# Patient Record
Sex: Male | Born: 1955 | Race: White | Hispanic: No | Marital: Married | Smoking: Never smoker
Health system: Southern US, Community
[De-identification: ages and names within clinical notes are randomized; demographics above are authoritative.]

## PROBLEM LIST (undated history)

## (undated) DIAGNOSIS — K219 Gastro-esophageal reflux disease without esophagitis: Secondary | ICD-10-CM

## (undated) DIAGNOSIS — I2510 Atherosclerotic heart disease of native coronary artery without angina pectoris: Secondary | ICD-10-CM

## (undated) DIAGNOSIS — Z8679 Personal history of other diseases of the circulatory system: Secondary | ICD-10-CM

## (undated) DIAGNOSIS — I10 Essential (primary) hypertension: Secondary | ICD-10-CM

## (undated) DIAGNOSIS — Z955 Presence of coronary angioplasty implant and graft: Secondary | ICD-10-CM

## (undated) DIAGNOSIS — Z8701 Personal history of pneumonia (recurrent): Secondary | ICD-10-CM

## (undated) HISTORY — DX: Personal history of pneumonia (recurrent): Z87.01

## (undated) HISTORY — PX: TONSILLECTOMY: SUR1361

## (undated) HISTORY — DX: Personal history of other diseases of the circulatory system: Z86.79

---

## 2006-05-30 ENCOUNTER — Ambulatory Visit (HOSPITAL_BASED_OUTPATIENT_CLINIC_OR_DEPARTMENT_OTHER): Admission: RE | Admit: 2006-05-30 | Discharge: 2006-05-30 | Payer: Self-pay | Admitting: Orthopedic Surgery

## 2012-02-03 DIAGNOSIS — Z8679 Personal history of other diseases of the circulatory system: Secondary | ICD-10-CM

## 2012-02-03 HISTORY — DX: Personal history of other diseases of the circulatory system: Z86.79

## 2012-02-06 ENCOUNTER — Other Ambulatory Visit: Payer: Self-pay | Admitting: Cardiology

## 2012-02-06 DIAGNOSIS — I10 Essential (primary) hypertension: Secondary | ICD-10-CM

## 2012-02-06 DIAGNOSIS — I200 Unstable angina: Secondary | ICD-10-CM | POA: Insufficient documentation

## 2012-02-06 DIAGNOSIS — E1169 Type 2 diabetes mellitus with other specified complication: Secondary | ICD-10-CM | POA: Insufficient documentation

## 2012-02-06 DIAGNOSIS — E8881 Metabolic syndrome: Secondary | ICD-10-CM

## 2012-02-06 DIAGNOSIS — E669 Obesity, unspecified: Secondary | ICD-10-CM

## 2012-02-06 DIAGNOSIS — R030 Elevated blood-pressure reading, without diagnosis of hypertension: Secondary | ICD-10-CM

## 2012-02-06 HISTORY — DX: Essential (primary) hypertension: I10

## 2012-02-06 HISTORY — DX: Unstable angina: I20.0

## 2012-02-07 ENCOUNTER — Encounter (HOSPITAL_COMMUNITY): Payer: Self-pay | Admitting: Pharmacy Technician

## 2012-02-07 HISTORY — PX: DOPPLER ECHOCARDIOGRAPHY: SHX263

## 2012-02-08 ENCOUNTER — Encounter (HOSPITAL_COMMUNITY): Payer: Self-pay | Admitting: Cardiology

## 2012-02-09 ENCOUNTER — Encounter (HOSPITAL_COMMUNITY)
Admission: RE | Disposition: A | Discharge status: Home / Self Care | Payer: Self-pay | Source: Ambulatory Visit | Attending: Cardiology

## 2012-02-09 ENCOUNTER — Ambulatory Visit (HOSPITAL_COMMUNITY)
Admission: RE | Admit: 2012-02-09 | Discharge: 2012-02-10 | Disposition: A | Discharge status: Home / Self Care | Source: Ambulatory Visit | Attending: Cardiology | Admitting: Cardiology

## 2012-02-09 ENCOUNTER — Encounter (HOSPITAL_COMMUNITY): Payer: Self-pay | Admitting: General Practice

## 2012-02-09 ENCOUNTER — Other Ambulatory Visit: Payer: Self-pay

## 2012-02-09 DIAGNOSIS — I25119 Atherosclerotic heart disease of native coronary artery with unspecified angina pectoris: Secondary | ICD-10-CM | POA: Diagnosis present

## 2012-02-09 DIAGNOSIS — E8881 Metabolic syndrome: Secondary | ICD-10-CM | POA: Diagnosis present

## 2012-02-09 DIAGNOSIS — I200 Unstable angina: Secondary | ICD-10-CM | POA: Diagnosis present

## 2012-02-09 DIAGNOSIS — I2510 Atherosclerotic heart disease of native coronary artery without angina pectoris: Secondary | ICD-10-CM

## 2012-02-09 DIAGNOSIS — E1169 Type 2 diabetes mellitus with other specified complication: Secondary | ICD-10-CM | POA: Diagnosis present

## 2012-02-09 DIAGNOSIS — E669 Obesity, unspecified: Secondary | ICD-10-CM | POA: Diagnosis present

## 2012-02-09 DIAGNOSIS — E119 Type 2 diabetes mellitus without complications: Secondary | ICD-10-CM | POA: Insufficient documentation

## 2012-02-09 DIAGNOSIS — I10 Essential (primary) hypertension: Secondary | ICD-10-CM | POA: Diagnosis present

## 2012-02-09 DIAGNOSIS — E66811 Obesity, class 1: Secondary | ICD-10-CM | POA: Diagnosis present

## 2012-02-09 DIAGNOSIS — Z955 Presence of coronary angioplasty implant and graft: Secondary | ICD-10-CM

## 2012-02-09 HISTORY — DX: Essential (primary) hypertension: I10

## 2012-02-09 HISTORY — DX: Presence of coronary angioplasty implant and graft: Z95.5

## 2012-02-09 HISTORY — PX: LEFT HEART CATHETERIZATION WITH CORONARY ANGIOGRAM: SHX5451

## 2012-02-09 HISTORY — DX: Atherosclerotic heart disease of native coronary artery without angina pectoris: I25.10

## 2012-02-09 HISTORY — DX: Gastro-esophageal reflux disease without esophagitis: K21.9

## 2012-02-09 HISTORY — PX: CORONARY ANGIOPLASTY WITH STENT PLACEMENT: SHX49

## 2012-02-09 LAB — GLUCOSE, CAPILLARY
Glucose-Capillary: 117 mg/dL — ABNORMAL HIGH (ref 70–99)
Glucose-Capillary: 115 mg/dL — ABNORMAL HIGH (ref 70–99)
Glucose-Capillary: 155 mg/dL — ABNORMAL HIGH (ref 70–99)

## 2012-02-09 SURGERY — LEFT HEART CATHETERIZATION WITH CORONARY ANGIOGRAM
Anesthesia: LOCAL

## 2012-02-09 MED ORDER — HEPARIN (PORCINE) IN NACL 2-0.9 UNIT/ML-% IJ SOLN
INTRAMUSCULAR | Status: AC
  Filled 2012-02-09: qty 2000

## 2012-02-09 MED ORDER — VERAPAMIL HCL 2.5 MG/ML IV SOLN
INTRAVENOUS | Status: AC
  Filled 2012-02-09: qty 2

## 2012-02-09 MED ORDER — SODIUM CHLORIDE 0.9 % IV SOLN
1.0000 mL/kg/h | INTRAVENOUS | Status: AC
  Administered 2012-02-09: 1 mL/kg/h via INTRAVENOUS

## 2012-02-09 MED ORDER — FENTANYL CITRATE 0.05 MG/ML IJ SOLN
INTRAMUSCULAR | Status: AC
  Filled 2012-02-09: qty 2

## 2012-02-09 MED ORDER — PRASUGREL HCL 10 MG PO TABS
10.0000 mg | ORAL_TABLET | Freq: Every day | ORAL | Status: DC
  Administered 2012-02-10: 10 mg via ORAL
  Filled 2012-02-09: qty 1

## 2012-02-09 MED ORDER — SODIUM CHLORIDE 0.9 % IV SOLN
INTRAVENOUS | Status: DC
  Administered 2012-02-09: 08:00:00 via INTRAVENOUS

## 2012-02-09 MED ORDER — HEPARIN SODIUM (PORCINE) 1000 UNIT/ML IJ SOLN
INTRAMUSCULAR | Status: AC
  Filled 2012-02-09: qty 1

## 2012-02-09 MED ORDER — ASPIRIN 81 MG PO CHEW
CHEWABLE_TABLET | ORAL | Status: AC
  Administered 2012-02-09: 324 mg via ORAL
  Filled 2012-02-09: qty 4

## 2012-02-09 MED ORDER — PRASUGREL HCL 10 MG PO TABS
ORAL_TABLET | ORAL | Status: AC
  Filled 2012-02-09: qty 6

## 2012-02-09 MED ORDER — NITROGLYCERIN 0.2 MG/ML ON CALL CATH LAB
INTRAVENOUS | Status: AC
  Filled 2012-02-09: qty 1

## 2012-02-09 MED ORDER — ACETAMINOPHEN 325 MG PO TABS: 650.0000 mg | ORAL_TABLET | ORAL | Status: DC | PRN

## 2012-02-09 MED ORDER — SODIUM CHLORIDE 0.9 % IJ SOLN: 3.0000 mL | Freq: Two times a day (BID) | INTRAMUSCULAR | Status: DC

## 2012-02-09 MED ORDER — MIDAZOLAM HCL 2 MG/2ML IJ SOLN
INTRAMUSCULAR | Status: AC
  Filled 2012-02-09: qty 2

## 2012-02-09 MED ORDER — ASPIRIN EC 81 MG PO TBEC
81.0000 mg | DELAYED_RELEASE_TABLET | Freq: Every day | ORAL | Status: DC
  Administered 2012-02-10: 81 mg via ORAL
  Filled 2012-02-09 (×2): qty 1

## 2012-02-09 MED ORDER — METOPROLOL TARTRATE 12.5 MG HALF TABLET
12.5000 mg | ORAL_TABLET | Freq: Two times a day (BID) | ORAL | Status: DC
  Administered 2012-02-09 – 2012-02-10 (×2): 12.5 mg via ORAL
  Filled 2012-02-09 (×3): qty 1

## 2012-02-09 MED ORDER — ONDANSETRON HCL 4 MG/2ML IJ SOLN: 4.0000 mg | Freq: Four times a day (QID) | INTRAMUSCULAR | Status: DC | PRN

## 2012-02-09 MED ORDER — INSULIN ASPART 100 UNIT/ML ~~LOC~~ SOLN
0.0000 [IU] | Freq: Three times a day (TID) | SUBCUTANEOUS | Status: DC
Start: 2012-02-09 — End: 2012-02-10
  Filled 2012-02-09: qty 3

## 2012-02-09 MED ORDER — ATORVASTATIN CALCIUM 20 MG PO TABS
20.0000 mg | ORAL_TABLET | Freq: Every day | ORAL | Status: DC
  Administered 2012-02-09: 20 mg via ORAL
  Filled 2012-02-09 (×3): qty 1

## 2012-02-09 MED ORDER — DIAZEPAM 5 MG PO TABS
ORAL_TABLET | ORAL | Status: AC
  Administered 2012-02-09: 5 mg via ORAL
  Filled 2012-02-09: qty 1

## 2012-02-09 MED ORDER — DIAZEPAM 5 MG PO TABS
5.0000 mg | ORAL_TABLET | ORAL | Status: AC
  Administered 2012-02-09: 5 mg via ORAL

## 2012-02-09 MED ORDER — LIDOCAINE HCL (PF) 1 % IJ SOLN
INTRAMUSCULAR | Status: AC
  Filled 2012-02-09: qty 30

## 2012-02-09 MED ORDER — BIVALIRUDIN 250 MG IV SOLR
INTRAVENOUS | Status: AC
  Filled 2012-02-09: qty 250

## 2012-02-09 MED ORDER — SODIUM CHLORIDE 0.9 % IV SOLN: 250.0000 mL | INTRAVENOUS | Status: DC

## 2012-02-09 MED ORDER — SODIUM CHLORIDE 0.9 % IJ SOLN: 3.0000 mL | INTRAMUSCULAR | Status: DC | PRN

## 2012-02-09 MED ORDER — ASPIRIN 81 MG PO CHEW
324.0000 mg | CHEWABLE_TABLET | ORAL | Status: AC
  Administered 2012-02-09: 324 mg via ORAL

## 2012-02-09 NOTE — H&P (Signed)
History and Physical Interval Note:  NAME:  Kristopher Jackson   MRN: [id number] DOB:  Nov 22, 1956   ADMIT DATE: 02/09/2012   02/09/2012 9:13 AM  Kristopher Jackson is a 56 y.o. male with DM-2, likely HTN & obesity who was seen by me @ [facility] on 02/06/12 for ~4 months of progressively worsening shortness of breath on exertion that is now accompanied by left sided chest pressure (over the past ~2 months).  Sx ocuring with less exertion & more frequency --> concerning for an unstable angina process of crescendo angina.  Please see my dictated note in the shadow chart for full details.  He has had an echocardiogram on 02/07/12 that was essentially normal with normal EF >55% and no WMA or valvular lesions.  Based upon his pre-test probability of CAD, he is referred for invasive cardiac evaluation.  The patients' history has been reviewed, patient examined, no change in status from most recent note, stable for surgery. I have reviewed the patients' chart and labs. Questions were answered to the patient's satisfaction.   Past Medical History  Diagnosis Date  . Hypertension   . Diabetes mellitus   . Angina    Medications Prior to Admission  Medication Dose Route Frequency Provider Last Rate Last Dose  . 0.9 %  sodium chloride infusion   Intravenous Continuous Marykay Lex, MD 75 mL/hr at 02/09/12 [id number]    . aspirin chewable tablet 324 mg  324 mg Oral Pre-Cath Marykay Lex, MD   324 mg at 02/09/12 0816  . diazepam (VALIUM) tablet 5 mg  5 mg Oral On Call Marykay Lex, MD   5 mg at 02/09/12 0819  . fentaNYL (SUBLIMAZE) 0.05 MG/ML injection           . heparin 2-0.9 UNIT/ML-% infusion           . lidocaine (XYLOCAINE) 1 % injection           . midazolam (VERSED) 2 MG/2ML injection           . nitroGLYCERIN (NTG ON-CALL) 0.2 mg/mL injection           . sodium chloride 0.9 % injection 3 mL  3 mL Intravenous Q12H Marykay Lex, MD      . verapamil (ISOPTIN) 2.5 MG/ML injection            Medications Prior to  Admission  Medication Sig Dispense Refill  . aspirin EC 81 MG tablet Take 81 mg by mouth daily.      . metFORMIN (GLUCOPHAGE) 500 MG tablet Take 500 mg by mouth every morning.      . metoprolol tartrate (LOPRESSOR) 25 MG tablet Take 25 mg by mouth 2 (two) times daily.      . Multiple Vitamin (MULITIVITAMIN WITH MINERALS) TABS Take 1 tablet by mouth daily.         Kristopher Jackson has presented today for surgery, with the diagnosis of chest pain The various methods of treatment have been discussed with the patient and family. After consideration of risks, benefits and other options for treatment, the patient has consented to Procedure(s):  LEFT HEART CATHETERIZATION AND CORONARY ANGIOGRAPHY +/- AD HOC PERCUTANEOUS CORONARY INTERVENTION  as a surgical intervention.   PLAN:  LHC / Angiography +/- PCI via R Radial approach   Kristopher Jackson W [facility] [address] [place], [place]  [address]  [phone]  02/09/2012 9:13 AM

## 2012-02-09 NOTE — Progress Notes (Signed)
TR BAND REMOVAL  LOCATION:  right radial  DEFLATED PER PROTOCOL:  yes  TIME BAND OFF / DRESSING APPLIED:   1630   SITE UPON ARRIVAL:   Level 0  SITE AFTER BAND REMOVAL:  Level 0  REVERSE ALLEN'S TEST:    positive  CIRCULATION SENSATION AND MOVEMENT:  Within Normal Limits  yes  COMMENTS:    

## 2012-02-09 NOTE — CV Procedure (Signed)
[facility]     CARDIAC CATHETERIZATION REPORT  Kristopher Jackson MRN: [id number] DOB: [birth year]  ADMIT DATE: 02/09/2012  Performing Cardiologist: Marykay Lex  Primary Physician: Brent Bulla, MD  Primary Cardiologist:  Marykay Lex, MD, MS  Procedures Performed:  Left Heart Catheterization via 5 Fr Right Radial Artery access  Aortic Root Angiogram (LAO) 20 ml/sec for 20 ml total contrast  Native Coronary Angiography  IC NTG Injection 200 mcg x 2  Percutaneous Coronary Artery Intervention on mid LAD 80% lesions (At branch-point with Major Diagonal (D2) and septal perforator (SP2) with a Integrity Resolute DES 3.0 mm x 26 mm; final diameter 3.3 mm proximal & mid; 3.1 mm distal.  Indication(s): Worsening shortness of breath and chest pain with exertion  Crescendo / unstable Angina  Diabetes Type 2  Obesity  History: Kristopher Jackson is a 56 y.o. male with a history of diabetes obesity and borderline hypertension who was referred to me at [facility] for evaluation of progressively worsening shortness of breath on exertion now complicated by chest pain with exertion. He states he gets chest tightness and short of breath which is walking to the elevator. Based on his risk factors of diabetes obesity and portal hypertension, I felt the most appropriate to evaluate his symptoms was to proceed to diagnostic cardiac catheterization with possible percutaneous intervention as his symptoms are quite classic for crescendo angina/unstable angina.  Consent: During his clinic visit, the procedure with Risks/Benefits/Alternatives and Indications was reviewed with the patient (and family).  All questions were answered.    Risks / Complications include, but not limited to: Death, MI, CVA/TIA, VF/VT (with defibrillation), Bradycardia (need for temporary pacer placement), contrast induced nephropathy, bleeding / bruising / hematoma /  pseudoaneurysm, vascular or coronary injury (with possible emergent CT or Vascular Surgery), adverse medication reactions, infection.    The patient voicedunderstanding and agree to proceed.   Consent for signed by MD and patient with RN witness -- placed on chart.  Procedure: The patient was brought to the 2nd Floor [facility] in the fasting state and prepped and draped in the usual sterile fashion for (Right groin or radial) access.  A modified Allen's test with plethysmography was performed on the right wrist demonstrating adequate Ulnar Artery collateral flow.   Sterile technique was used including antiseptics, cap, gloves, gown, hand hygiene, mask and sheet. Skin prep: Chlorhexidine;  Time Out: Verified patient identification, verified procedure, site/side was marked, verified correct patient position, special equipment/implants available, medications/allergies/relevent history reviewed, required imaging and test results available.  Performed  The right wrist was anesthetized with 1% subcutaneous Lidocaine.  The right radial artery was accessed using the Seldinger Technique with placement of a 5 Fr Glide Sheath. The sheath was aspirated and flushed.  Then a total of 10 ml of standard Radial Artery Cocktail (see medications) was infused.  Radial Cocktail: 5 mg Verapamil, 400 mcg NTG, 2 ml 2% Lidocaine  A 5 Fr TIG 4.0 Catheter was advanced of over a Long Exchange Safety J wire into the ascending Aorta.  This catheter was was not successful used to engage either the right or left coronary artery therefore it was exchanged for a 5 [place] JL4 catheter followed by a XB 35 guide catheter for engaging the left coronary artery. Multiple cineangiographic views of both the Left coronary artery system(s) were performed. With this indicated were unable to actually visualize the circumflex coronary artery. The catheter was exchanged  for a 5 [place] JR 4 catheter was used to engage the  right coronary artery multiple cineangiographic views of the right courses were obtained. This also did not localize the circumflex. A 5 French angle pigtail catheter was advanced into the descending aorta pullback across the aortic valve and was used to perform a root aortic angiogram in the LAO projection which did demonstrate the circumflex coming off from a separate ostium in the LAD. Multiple additional catheters including an XB 4.0, a JL 3.5 followed by an AL-1 guiding catheterTuhi attached to allow for wire support. With this apparatus were able to successfully performed nonselective angiography of the circumflex artery which had a separate ostium superior and posterior to the LAD left main.  The TIG 4.0 catheter upon initial placement, was advanced across the Aortic Valve over a wire.  LV hemodynamics were measured (and) Left Ventriculography was not performed.   Hemodynamics:  Central Aortic Pressure: 86/61 mmHg; mean 73 mmHg  LV Pressure / LV End diastolic Pressure: 86/10 mmHg; 15 mmHg Ventriculography was not performed. His echocardiogram demonstrated EF of 55% no wall motion abnormalities on 02/07/2012. Aortic root angiography revealed normal caliber aortic root with no aortic insufficiency.  Coronary Angiographic Data:   Dominance: Right  Left Main:  Essentially is the proximal portion of the LAD. It is long and there is a proximal small diagonal branch was delineates the transition point in the LAD.  Left Anterior Descending (LAD):  Large caliber vessel after the first small septal perforator and diagonal branch which has a ostial lesion in it there is luminal irregularities was a slight pre-stenotic dilatation followed by 50% tubular lesion just prior to a branch point for a large septal perforator (SP2) and major diagonal branch (D2).  Just after this branch point there is a tapering tubular 80% lesion with the vessel becomes normal at the next small septal perforator.    There is  also a distal LAD 50-60% lesion just prior to the apex; the LAD itself right reaches around the apex  2nd diagonal (D2):  This is a major vessel large in caliber that has minimal luminal irregularities and covers a large distribution.     Circumflex (LCx):  This vessel is a nondominant vessel it has a separate ostium from the left main/LAD. There is a proximal 30% lesion, the vessel courses the intraventricular groove giving rise to a large OM branch and then the AV groove portion branches again in 2 posterior lateral branches.  1st obtuse marginal:   Moderate caliber vessel, branches with no significant lesions.  Right Coronary Artery: Very large caliber dominant vessel gives rise to an RV marginal branch as well as a 2 atrial branches. Just at the distal 30-40% lesion prior to bifurcation into the posterior atrioventricular groove branch and PDA.  Right Posterior Descending Artery (RPDA):  Moderate caliber vessel and multiple septal perforators and no significant lesions. Minimal luminal irregularities  Right Atrio-ventricular Groove Branch (RPAVB):  Moderate caliber vessel branches of the tube and PLBs and the AV nodal artery.  PERCUTANEOUS CORONARY INTERVENTION PROCEDURE  After reviewing the initial cineangiography images, the culprit lesion(s) was identified, and the decision was made to proceed with percutaneous coronary intervention.  The 5Fr Sheath was exchanged over a wire for a 6Fr sheath.   A weight based bolus of IV Angiomax was administered and the drip was continued until completion of the procedure.    Oral Prasugrel 60 mg was administered.  The Guide catheter(s) were advanced over  a J-wire and used to engage the Left Main/LAD Coronary Artery. An ACT of > 200 Sec was confirmed prior to advancing the Guidewire. A BMW wire was used to cross into the major diagonal branch and a Prowater wire was used for the LAD.  At the end of the wire was removed prior to deflating the  stent. Intravenous push and injections were administered prior to each dose balloon or stent angiography.  Lesion #1:  Mid LAD; 20 mm lesion (most significant portion is 80% preceded by a tubular 50%)  Pre-PCI Stenosis: 80 % Post-PCI Stenosis: 0 %     TIMI 3 flow       TIMI 3 flow  Guide Catheter: 6 French XB 3.5   Guidewire: Prowater  Pre-Dilitation Balloon: Emerge Monorail 2.5 mm x 12 mm   1st Inflation:  6 Atm for 30 Sec  Scout angiography did not reveal evidence of dissection or perforation.  There was no evidence of plaque shift into the diagonal branch or septal branch. STENT: Integrity Resolute Drug-Eluting Stent 3.0 mm x 26 mm (to cover the proximal 50% lesion and a tapering portion of distal lesion )   1st Inflation: 10 Atm for  33 Sec; final distal diameter 3.1 mm    Post-Dilitation Balloon:  [place] Sprinter Rx 3.25 mm x 15 mm   1st Inflation:  14 Atm for  45 Sec;   2nd Inflation:  14 Atm for  45 Sec   Final diameter proximally 3.3 mm   Post-deployment cineangiography with and without guidewire in place was performed in multiple orthogonal views demonstrating  excellent stent deployment and apposition. The views  did not reveal evidence of dissection or perforation  The sheath was removed in the Cath Lab with a TR band placed at  12 ml Air at  1114 (time).  Reverse Allen's test with plethysmography revealed non-occlusive hemostasis.  The patient was transported to the PACU in  hemodynamically stable, chest pain-freecondition.   The patient  was stable before, during and following the procedure.   Patient did tolerate procedure well. There were not complications.  EBL: <10 mL.  Medications:  Premedication:  5mg   Valium  Sedation:   3 mg IV Versed,  75 IV mcg Fentanyl  Contrast:   225 Omnipaque  Radial Cocktail: 5 mg Verapamil, 400 mcg NTG, 2 ml 2% Lidocaine  IV Heparin: 5000 units    Angiomax bolus and drip (weight-based))  [place] 60 mg by mouth  Impression:    Severe  single-vessel disease in the mid LAD involving the major diagonal and septal perforator branch point. The lesion spares the diagonal and septal perforators.  Status post successful PTCA-stenting of his mid LAD lesion with an Integrity Resolute DES -- 3.0 mm x 26 mm Postell at approximately 23.3 mm and distally to 3.1 mm.  Residual distal LAD 56 the lesion and moderate 30-40% lesion in the proximal Circumflex and distal dominant RCA.   Separate ostium for the nondominant Circumflex from the left cusp appears to be superior and posterior to the LAD.   Due to separate ostium will would not recommend further catheterization via the radial approach, as his innominate artery tortuosity lead to difficulty with catheter manipulation.  Plan:  Monitor overnight post-cath to care.  Dual antiplatelet therapy with aspirin and Prasugrel for one year minimum  Due to bradycardia Wants Her to Decrease His Beta Blocker Dose 12.5 Twice a Day. We'll Add a Statin for Risk Factor Modification.  Restart metformin 48  hours post-catheterization.  Anticipate discharge in morning if stable he'll followup with me in one to 2 weeks the [facility].  ThThease and results was discussed with the patient (and family). The case and results  will be discussed with the patient's PCP. The case and results was discussed with the patient's Cardiologist.  Time Spend Directly with Patient:   2 hours  Socrates Cahoon W, M.D., M.S. [facility] [address] [place], [place]  [address]  [phone]  02/09/2012 11:48 AM

## 2012-02-09 NOTE — Progress Notes (Signed)
   CARE MANAGEMENT NOTE 02/09/2012  Patient:  Kristopher Jackson, Kristopher Jackson   Account Number:  [id number]  Date Initiated:  02/09/2012  Documentation initiated by:  GRAVES-BIGELOW,Presleigh Feldstein  Subjective/Objective Assessment:   Pt in for LHC. Plan for home on effient. Pt uses [facility]. CM did call and they have effient available. Benefits check was done and co pay cost will be 17.00.     Action/Plan:   MD please write rx for 30 day free effient no refills and then the original with refills. Thanks   Anticipated DC Date:  02/10/2012   Anticipated DC Plan:  HOME/SELF CARE      DC Planning Services  CM consult      Choice offered to / List presented to:             Status of service:  Completed, signed off Medicare Important Message given?   (If response is "NO", the following Medicare IM given date fields will be blank) Date Medicare IM given:   Date Additional Medicare IM given:    Discharge Disposition:  HOME/SELF CARE  Per UR Regulation:    Comments:  02-09-12 [address] Tomi Bamberger, RN,BSN [phone] No other needs assessed by CM.

## 2012-02-10 ENCOUNTER — Encounter (HOSPITAL_COMMUNITY): Payer: Self-pay | Admitting: Cardiology

## 2012-02-10 ENCOUNTER — Other Ambulatory Visit: Payer: Self-pay

## 2012-02-10 LAB — CBC
Hemoglobin: 13.7 g/dL (ref 13.0–17.0)
MCHC: 34.5 g/dL (ref 30.0–36.0)
RBC: 4.61 MIL/uL (ref 4.22–5.81)
WBC: 11.5 10*3/uL — ABNORMAL HIGH (ref 4.0–10.5)

## 2012-02-10 LAB — BASIC METABOLIC PANEL
Chloride: 102 mEq/L (ref 96–112)
GFR calc non Af Amer: 67 mL/min — ABNORMAL LOW (ref >90)
Glucose, Bld: 116 mg/dL — ABNORMAL HIGH (ref 70–99)
Potassium: 4.8 mEq/L (ref 3.5–5.1)
Sodium: 137 mEq/L (ref 135–145)

## 2012-02-10 MED ORDER — NITROGLYCERIN 0.4 MG SL SUBL: 0.4000 mg | SUBLINGUAL_TABLET | SUBLINGUAL | Status: DC | PRN

## 2012-02-10 MED ORDER — PRASUGREL HCL 10 MG PO TABS: 10.0000 mg | ORAL_TABLET | Freq: Every day | ORAL | Status: DC

## 2012-02-10 MED ORDER — ROSUVASTATIN CALCIUM 5 MG PO TABS: 5.0000 mg | ORAL_TABLET | Freq: Every day | ORAL | Status: AC

## 2012-02-10 MED ORDER — METOPROLOL TARTRATE 25 MG PO TABS: ORAL_TABLET | ORAL | Status: DC

## 2012-02-10 MED ORDER — METOPROLOL TARTRATE 12.5 MG HALF TABLET: 12.5000 mg | ORAL_TABLET | Freq: Two times a day (BID) | ORAL | Status: DC

## 2012-02-10 MED FILL — Dextrose Inj 5%: INTRAVENOUS | Qty: 50 | Status: AC

## 2012-02-10 NOTE — Progress Notes (Signed)
Kristopher Jackson is a 56 y.o. male with DM-2, likely HTN & obesity who was seen by me @ [facility] on 02/06/12 for ~4 months of progressively worsening shortness of breath on exertion that is now accompanied by left sided chest pressure (over the past ~2 months). Sx ocuring with less exertion & more frequency --> concerning for an unstable angina process of crescendo angina. Please see my dictated note in the shadow chart for full details.  He has had an echocardiogram on 02/07/12 that was essentially normal with normal EF >55% and no WMA or valvular lesions. Based upon his pre-test probability of CAD, he is referred for invasive cardiac evaluation.    Subjective: No pain with brisk walking, no SOB either  Objective: Vital signs in last 24 hours: Temp:  [97.5 F (36.4 C)-98 F (36.7 C)] 97.7 F (36.5 C) (03/08 0900) Pulse Rate:  [48-71] 71  (03/08 0900) Resp:  [15-25] 17  (03/08 0900) BP: (97-142)/(56-81) 140/81 mmHg (03/08 0900) SpO2:  [97 %-100 %] 98 % (03/08 0900) Weight:  [98.1 kg (216 lb 4.3 oz)] 98.1 kg (216 lb 4.3 oz) (03/08 0352) Weight change:  Last BM Date: 02/09/12 Intake/Output from previous day: +480 03/07 0701 - 03/08 0700 In: 2180.8 [P.O.:1320; I.V.:860.8] Out: 1700 [Urine:1700] Intake/Output this shift:    PE: General appearance: alert, cooperative, no distress and pleasant mood & affect Neck: no adenopathy, no carotid bruit, no JVD, supple, symmetrical, trachea midline and thyroid not enlarged, symmetric, no tenderness/mass/nodules Lungs: clear to auscultation bilaterally, normal percussion bilaterally and non-labored Heart: regular rate and rhythm, S1, S2 normal, no murmur, click, rub or gallop Abdomen: soft, non-tender; bowel sounds normal; no masses,  no organomegaly Extremities: extremities normal, atraumatic, no cyanosis or edema Pulses: 2+ and symmetric Neurologic: Grossly normal R Radial access site c/d/i; normalreverse Allen's   Lab Results:  Basename 02/10/12 0346    WBC 11.5*  HGB 13.7  HCT 39.7  PLT 172   BMET  Basename 02/10/12 0346  NA 137  K 4.8  CL 102  CO2 28  GLUCOSE 116*  BUN 15  CREATININE 1.19  CALCIUM 8.8     Studies/Results:  CARDIAC CATHETERIZATION / PCI Mid LAD; 20 mm lesion (most significant portion is 80% preceded by a tubular 50%)  Pre-PCI Stenosis: 80 % Post-PCI Stenosis: 0 %  TIMI 3 flow TIMI 3 flow  Guide Catheter: 6 French XB 3.5 Guidewire: Prowater  Pre-Dilitation Balloon: Emerge Monorail 2.5 mm x 12 mm  1st Inflation: 6 Atm for 30 Sec  Scout angiography did not reveal evidence of dissection or perforation. There was no evidence of plaque shift into the diagonal branch or septal branch.  STENT: Integrity Resolute Drug-Eluting Stent 3.0 mm x 26 mm (to cover the proximal 50% lesion and a tapering portion of distal lesion )  1st Inflation: 10 Atm for 33 Sec; final distal diameter 3.1 mm  Post-Dilitation Balloon: [place] Sprinter Rx 3.25 mm x 15 mm  1st Inflation: 14 Atm for 45 Sec;  2nd Inflation: 14 Atm for 45 Sec ; Final diameter proximally 3.3 mm  Medications: I have reviewed the patient's current medications.    Marland Kitchen aspirin EC  81 mg Oral Daily  . atorvastatin  20 mg Oral q1800  . bivalirudin      . heparin      . heparin      . insulin aspart  0-9 Units Subcutaneous TID WC  . lidocaine      . metoprolol tartrate  12.5 mg Oral BID  . midazolam      . nitroGLYCERIN      . prasugrel      . prasugrel  10 mg Oral Daily  . verapamil      . DISCONTD: sodium chloride  3 mL Intravenous Q12H  . DISCONTD: sodium chloride  3 mL Intravenous Q12H   Assessment/Plan: Patient Active Problem List  Diagnoses  . Unstable angina pectoris  . Diabetes mellitus type 2 in obese  . Obesity (BMI 30.0-34.9)  . Metabolic syndrome  . Borderline hypertension  . Presence of stent in LAD coronary artery - Integrity Resolute Drug Eluting Stent, mid LAD  . CAD (coronary artery disease), native coronary artery - 80% mid LAD - s/p PCI  with DES with distal ~50%, 30-40% RCA & LCx (anomalous LCx takeoff)   PLAN:  Ambulates without complications.  Plan to discharge home. Glucose stable, no metformin X 48 hours.  LOS: 1 day   INGOLD,LAURA R 02/10/2012, 9:09 AM  I have seen and examined (above exam performed by me) the patient along with Nada Boozer, NP.  I have reviewed the chart, notes and new data.  I agree with Laura's note.  Brief Description: 56 y/o man with DM & obesity referred for LHC/PCI for SSx of cresecnedo / unstable angina.   Doing well s/p PCI of Mid LAD yesterday.  Key new complaints: none - feels good Key examination changes: normal exam as noted above, wrist looks great Key new findings / data: labs reviewed.    PLAN: Stable for discharge today -- ASA, Prasugrel, 1/2 dose BB & statin F/u with me in 1-2 weeks.  Marykay Lex, M.D., M.S. [facility] [address] [place], [place]  [address]  [phone]  02/10/2012 10:39 AM

## 2012-02-10 NOTE — Progress Notes (Signed)
CARDIAC REHAB PHASE I   PRE:  Rate/Rhythm: 64SR  BP:  Supine:   Sitting: 122/84  Standing:    SaO2: 99%RA  MODE:  Ambulation: 650 ft   POST:  Rate/Rhythem: 94SR  BP:  Supine:   Sitting: 140/81  Standing:    SaO2: 100%RA 0730-0820 Pt walked 650 ft on RA with steady gait. Denied chest pain. Stated not SOB. He has a fast pace when walking. Education completed. 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 Gave heart healthy and diabetic diets. Declined  Phase 2.  Duanne Limerick

## 2012-02-10 NOTE — Discharge Instructions (Signed)
Call [facility] if any bleeding, swelling or drainage at cath site.  May shower, no tub baths for 48 hours for groin sticks.    Take 1 NTG, under your tongue, while sitting.  If no relief of pain may repeat NTG, one tab every 5 minutes up to 3 tablets total over 15 minutes.  If no relief CALL 911.  If you have dizziness/lightheadness  while taking NTG, stop taking and call 911.       Heart Healthy Diabetic Diet  No lifting for 5 days, no driving for 3 days  DO NOT TAKE METFORMIN UNTIL 02/12/12 it may interact with cath dye.  We have added Crestor for your cholesterol

## 2012-02-12 NOTE — Discharge Summary (Signed)
Physician Discharge Summary  Patient ID: Riaz Onorato MRN: [id number] DOB/AGE: 1956-05-18 56 y.o.  Admit date: 02/09/2012 Discharge date: 02/10/2012  Discharge Diagnoses:  Principal Problem:  *Unstable angina pectoris Active Problems:  Presence of stent in LAD coronary artery - Integrity Resolute Drug Eluting Stent, mid LAD, 02/09/2012  CAD (coronary artery disease), native coronary artery - 80% mid LAD - s/p PCI with DES with distal ~50%, 30-40% RCA & LCx (anomalous LCx takeoff)  Diabetes mellitus type 2 in obese  Obesity (BMI 30.0-34.9)  Metabolic syndrome  Borderline hypertension   Discharged Condition: good  Procedures: 02/09/2012 combined left heart cath by Dr. Bryan Lemma.  02/09/2012: PTCA and Resolute drug eluting stent placed to the mid LAD.Marland Kitchen  Hospital Course: Fotios Amos is a 56 y.o. male with DM-2, likely HTN & obesity who was seen by Dr. Herbie Baltimore @ [facility] on 02/06/12 for ~4 months of progressively worsening shortness of breath on exertion that is now accompanied by left sided chest pressure (over the past ~2 months). Sx ocuring with less exertion & more frequency --> concerning for an unstable angina process of crescendo angina. Please see dictated note in the shadow chart for full details.   He has had an echocardiogram on 02/07/12 that was essentially normal with normal EF >55% and no WMA or valvular lesions. Based upon his pre-test probability of CAD, he is referred for invasive cardiac evaluation.  Cardiac cath revealed 80% mid LAD stenosis patient underwent PTCA and stent deployment with a resolute drug-eluting stent without complications.  Patient was admitted overnight for monitoring purposes he was stable the next morning he ambulated without complications glucose was stable and metformin will be held for 48 hours.  Discharged with aspirin,decreased his beta blocker secondary to bradycardia and added statin as well as Effient  follow up with Dr. Herbie Baltimore.     Consults:  None  Significant Diagnostic Studies:  Labs at discharge sodium 137 potassium 4.8 chloride 102 CO2 28 BUN 15 creatinine 1.19 calcium 8.8 glucose 116  Hemoglobin 13.7 hematocrit 39.7 WBC 11.5 platelets 172 EKG is sinus rhythm without acute changes.  Procedures Performed:  Left Heart Catheterization via 5 Fr Right Radial Artery access  Aortic Root Angiogram (LAO) 20 ml/sec for 20 ml total contrast  Native Coronary Angiography  IC NTG Injection 200 mcg x 2  Percutaneous Coronary Artery Intervention on mid LAD 80% lesions (At branch-point with Major Diagonal (D2) and septal perforator (SP2) with a Integrity Resolute DES 3.0 mm x 26 mm; final diameter 3.3 mm proximal & mid; 3.1 mm distal.   Discharge Exam: Blood pressure 140/81, pulse 71, temperature 97.7 F (36.5 C), temperature source Oral, resp. rate 17, height 5\' 8"  (1.727 m), weight 98.1 kg (216 lb 4.3 oz), SpO2 98.00%.   General appearance: alert, cooperative, no distress and pleasant mood & affect  Neck: no adenopathy, no carotid bruit, no JVD, supple, symmetrical, trachea midline and thyroid not enlarged, symmetric, no tenderness/mass/nodules  Lungs: clear to auscultation bilaterally, normal percussion bilaterally and non-labored  Heart: regular rate and rhythm, S1, S2 normal, no murmur, click, rub or gallop  Abdomen: soft, non-tender; bowel sounds normal; no masses, no organomegaly  Extremities: extremities normal, atraumatic, no cyanosis or edema  Pulses: 2+ and symmetric  Neurologic: Grossly normal  R Radial access site c/d/i; normalreverse Allen's    Disposition: 01-Home or Self Care   Medication List  As of 02/12/2012  2:45 PM   TAKE these medications  aspirin EC 81 MG tablet   Take 81 mg by mouth daily.      metFORMIN 500 MG tablet   Commonly known as: GLUCOPHAGE   Take 500 mg by mouth every morning.      metoprolol tartrate 25 MG tablet   Commonly known as: LOPRESSOR   Take 12.5 mg  (half of 25 mg tablet)  twice a day.      mulitivitamin with minerals Tabs   Take 1 tablet by mouth daily.      nitroGLYCERIN 0.4 MG SL tablet   Commonly known as: NITROSTAT   Place 1 tablet (0.4 mg total) under the tongue every 5 (five) minutes as needed for chest pain.      prasugrel 10 MG Tabs   Commonly known as: EFFIENT   Take 1 tablet (10 mg total) by mouth daily.      rosuvastatin 5 MG tablet   Commonly known as: CRESTOR   Take 1 tablet (5 mg total) by mouth daily.           Follow-up Information    Follow up with Marykay Lex, MD on 02/24/2012. (You will follow up with Dr. Elissa Hefty PA  Carmelina Paddock  at 2:10)    Contact information:   [facility] [address], [address] [place] [address] [phone]        Discharge instructions: Call [facility] if any bleeding, swelling or drainage at cath site.  May shower, no tub baths for 48 hours for groin sticks.    Take 1 NTG, under your tongue, while sitting.  If no relief of pain may repeat NTG, one tab every 5 minutes up to 3 tablets total over 15 minutes.  If no relief CALL 911.  If you have dizziness/lightheadness  while taking NTG, stop taking and call 911.       Heart Healthy Diabetic Diet  No lifting for 5 days, no driving for 3 days  DO NOT TAKE METFORMIN UNTIL 02/12/12 it may interact with cath dye.  We have added Crestor for your cholesterol    Signed: INGOLD,LAURA R 02/12/2012, 2:45 PM   I saw Mr. Overby on the day of discharge. He was doing great well post radial artery access cardiac catheterization with PCI to the LAD. He was walking around the hallways doing well not having any chest discomfort or shortness of breath. He is definitely stable for discharge. I'll see him back in clinic in followup. I have added statin for endothelial stabilization.  He'll be on aspirin and Effient.  Marykay Lex, M.D., M.S. [facility] [address] [place], [place]  [address]  [phone]  02/12/2012 3:12 PM

## 2013-06-14 ENCOUNTER — Telehealth: Payer: Self-pay | Admitting: Cardiology

## 2013-06-14 NOTE — Telephone Encounter (Signed)
Med list compared to paper chart and updated.    Returned call.  Left message that message received and sent to MD.  Also to call back Monday before 4pm.  Message forwarded to Dr. Herbie Baltimore.  Paper chart# [id number] on cart.

## 2013-06-14 NOTE — Telephone Encounter (Signed)
Pt would like for someone to call back about his medication. He is supposed to have some dental work done and he needs to find out if it is ok.

## 2013-06-16 NOTE — Telephone Encounter (Signed)
He had a PCI in March 2013, so he should be fine stopping his Plavix or Effient for dental work -- just restart the next day.  Marykay Lex, MD

## 2013-06-17 NOTE — Telephone Encounter (Signed)
Shouldn't need to stop ASA.  Can stop Effient x 5-7 days pre-procedure.  Marykay Lex, MD

## 2013-06-17 NOTE — Telephone Encounter (Signed)
How long can pt stop ASA and Effient before dental work?

## 2013-06-17 NOTE — Telephone Encounter (Signed)
Letter drafted.  Call to pt and left message to call back today before 4pm.  (2nd message)  Letter mailed to pt.

## 2013-07-03 ENCOUNTER — Other Ambulatory Visit: Payer: Self-pay | Admitting: *Deleted

## 2013-07-03 ENCOUNTER — Other Ambulatory Visit: Payer: Self-pay | Admitting: Cardiology

## 2013-07-03 MED ORDER — ATORVASTATIN CALCIUM 20 MG PO TABS: 20.0000 mg | ORAL_TABLET | Freq: Every day | ORAL | Status: DC

## 2013-07-03 NOTE — Telephone Encounter (Signed)
Rx was printed for [facility] to sign and will be faxed once signature is received.

## 2013-10-17 ENCOUNTER — Other Ambulatory Visit: Payer: Self-pay | Admitting: Cardiology

## 2013-10-18 ENCOUNTER — Other Ambulatory Visit (HOSPITAL_COMMUNITY): Payer: Self-pay | Admitting: Cardiology

## 2013-10-18 ENCOUNTER — Other Ambulatory Visit: Payer: Self-pay

## 2013-10-18 ENCOUNTER — Other Ambulatory Visit: Payer: Self-pay | Admitting: Cardiology

## 2013-10-18 MED ORDER — NITROGLYCERIN 0.4 MG SL SUBL: 0.4000 mg | SUBLINGUAL_TABLET | SUBLINGUAL | Status: DC | PRN

## 2013-10-18 MED ORDER — LISINOPRIL 5 MG PO TABS: ORAL_TABLET | ORAL | Status: DC

## 2013-10-18 NOTE — Telephone Encounter (Signed)
Rx was sent to pharmacy electronically. 

## 2013-12-24 ENCOUNTER — Other Ambulatory Visit: Payer: Self-pay | Admitting: Cardiology

## 2013-12-27 ENCOUNTER — Other Ambulatory Visit: Payer: Self-pay | Admitting: *Deleted

## 2013-12-27 MED ORDER — PRASUGREL HCL 10 MG PO TABS: 10.0000 mg | ORAL_TABLET | Freq: Every day | ORAL | Status: DC

## 2014-02-10 ENCOUNTER — Telehealth: Payer: Self-pay | Admitting: *Deleted

## 2014-02-10 DIAGNOSIS — E782 Mixed hyperlipidemia: Secondary | ICD-10-CM

## 2014-02-10 DIAGNOSIS — Z79899 Other long term (current) drug therapy: Secondary | ICD-10-CM

## 2014-02-10 NOTE — Telephone Encounter (Signed)
Paper chart requested.

## 2014-02-10 NOTE — Telephone Encounter (Signed)
Please call the pt back  °

## 2014-02-10 NOTE — Telephone Encounter (Signed)
Pt has an appointment with Dr. Herbie BaltimoreHarding on 3/26 @9 :30 and he needs to have blood work done prior and wants to know if we can mail him a lab slip.  DH

## 2014-02-10 NOTE — Telephone Encounter (Signed)
Returned call and pt verified x 2.  Pt informed message received.  Asked if labs done w/ PCP and denied.  Advised to have Hgb A1C ordered by PCP if needed and that other labs will be ordered for Dr. Herbie BaltimoreHarding.  Pt advised to have drawn at least 24 hrs before appt so that results can be reviewed at appt.  Pt verbalized understanding and agreed w/ plan.  Lab(s) ordered and Lab slip mailed.

## 2014-02-10 NOTE — Telephone Encounter (Signed)
Returned call and pt verified x 2 w/ pt's wife.  Stated she was returning a call.  Didn't know pt called.  Will call pt and have him call back.  Asked if she would inform pt to have RN paged to speak w/ him since he is at work.  Agreed.  Per Jasmine DecemberSharon, RN, need to know if pt had labs drawn w/ PCP so no duplication.

## 2014-02-10 NOTE — Telephone Encounter (Signed)
Returning your call. °

## 2014-02-10 NOTE — Telephone Encounter (Signed)
Returned call.  Left message to call back before 4pm.  Need to know if pt had labs at PCP's office.

## 2014-02-13 ENCOUNTER — Telehealth: Payer: Self-pay | Admitting: Cardiology

## 2014-02-13 NOTE — Telephone Encounter (Signed)
Patient is there at the office , but they need a lab order ..Kristopher Jackson

## 2014-02-13 NOTE — Telephone Encounter (Signed)
Returned call to General MillsMelissa.  Stated pt has already left the office.  Stated they received the order from pt's PCP to draw an A1C, but pt wasn't sure what else was needed.  Stated he said he was told to go there to get A1C and then get his cholesterol checked w/ our office.  RN informed Efraim KaufmannMelissa that pt was instructed to get an ORDER for Hgb A1C if PCP needed it w/ annual labs and that other lab order would be mailed to him, which was done on Monday.  Melissa verbalized understanding and stated only A1C was drawn b/c they didn't know what else was needed.  RN informed her that pt should have received lab order by now or will be receiving any day.  He will have to have those drawn before appt.  Call to pt and verified.  Pt informed of call from lab.  Stated he went to get the order, but they drew the A1C while he was there.  Stated he wanted to have the orders for the labs from our office so he could have it all done at one time.  Pt denied receiving lab orders mailed.  Pt will find out if they will draw the labs once he receives the orders and have faxed to our office so [facility].

## 2014-02-24 ENCOUNTER — Encounter: Payer: Self-pay | Admitting: *Deleted

## 2014-02-24 LAB — COMPREHENSIVE METABOLIC PANEL
ALT: 24 U/L (ref 0–53)
AST: 20 U/L (ref 0–37)
Albumin: 4.3 g/dL (ref 3.5–5.2)
Alkaline Phosphatase: 108 U/L (ref 39–117)
BILIRUBIN TOTAL: 0.4 mg/dL (ref 0.2–1.2)
BUN: 17 mg/dL (ref 6–23)
CO2: 26 mEq/L (ref 19–32)
Calcium: 9 mg/dL (ref 8.4–10.5)
Chloride: 106 mEq/L (ref 96–112)
Creat: 1.08 mg/dL (ref 0.50–1.35)
Glucose, Bld: 156 mg/dL — ABNORMAL HIGH (ref 70–99)
Potassium: 4.6 mEq/L (ref 3.5–5.3)
SODIUM: 141 meq/L (ref 135–145)
Total Protein: 5.8 g/dL — ABNORMAL LOW (ref 6.0–8.3)

## 2014-02-24 LAB — LIPID PANEL
Cholesterol: 106 mg/dL (ref 0–200)
HDL: 37 mg/dL — ABNORMAL LOW (ref >39)
LDL Cholesterol: 60 mg/dL (ref 0–99)
Total CHOL/HDL Ratio: 2.9 Ratio
Triglycerides: 44 mg/dL (ref <150)
VLDL: 9 mg/dL (ref 0–40)

## 2014-02-25 ENCOUNTER — Encounter: Payer: Self-pay | Admitting: *Deleted

## 2014-02-25 NOTE — Telephone Encounter (Signed)
Quick Note:  Chemistries with the exception of glucose looked pretty good. Protein is just a little below which would suggest mild protein malnutrition. HDL is a bit below normal, but the other labs look great. HDL is probably low because total cholesterol is low.  Continue current therapy.  Marykay LexHARDING,Ashlye Oviedo W, MD  ______

## 2014-02-27 ENCOUNTER — Encounter: Payer: Self-pay | Admitting: Cardiology

## 2014-02-27 ENCOUNTER — Ambulatory Visit (INDEPENDENT_AMBULATORY_CARE_PROVIDER_SITE_OTHER): Admitting: Cardiology

## 2014-02-27 VITALS — BP 138/70 | HR 61 | Ht 68.0 in | Wt 220.2 lb

## 2014-02-27 DIAGNOSIS — Z79899 Other long term (current) drug therapy: Secondary | ICD-10-CM

## 2014-02-27 DIAGNOSIS — E8881 Metabolic syndrome: Secondary | ICD-10-CM

## 2014-02-27 DIAGNOSIS — E785 Hyperlipidemia, unspecified: Secondary | ICD-10-CM

## 2014-02-27 DIAGNOSIS — R030 Elevated blood-pressure reading, without diagnosis of hypertension: Secondary | ICD-10-CM

## 2014-02-27 DIAGNOSIS — I200 Unstable angina: Secondary | ICD-10-CM

## 2014-02-27 DIAGNOSIS — I2510 Atherosclerotic heart disease of native coronary artery without angina pectoris: Secondary | ICD-10-CM

## 2014-02-27 DIAGNOSIS — Z9861 Coronary angioplasty status: Secondary | ICD-10-CM

## 2014-02-27 DIAGNOSIS — Z955 Presence of coronary angioplasty implant and graft: Secondary | ICD-10-CM

## 2014-02-27 DIAGNOSIS — E669 Obesity, unspecified: Secondary | ICD-10-CM

## 2014-02-27 MED ORDER — PRASUGREL HCL 10 MG PO TABS: 10.0000 mg | ORAL_TABLET | Freq: Every day | ORAL | Status: DC

## 2014-02-27 MED ORDER — NITROGLYCERIN 0.4 MG SL SUBL: 0.4000 mg | SUBLINGUAL_TABLET | SUBLINGUAL | Status: DC | PRN

## 2014-02-27 MED ORDER — CLOPIDOGREL BISULFATE 75 MG PO TABS: 75.0000 mg | ORAL_TABLET | Freq: Every day | ORAL | Status: DC

## 2014-02-27 NOTE — Patient Instructions (Signed)
Stop effient  and start CLOPIDOGREL 75 MG ONCE A DAY IN THREE WEEKS HAVE LABS P2Y212  PLEASE GO TO [facility]   Your physician wants you to follow-up in 12 MONTHS DR HARDING. You will receive a reminder letter in the mail two months in advance. If you don't receive a letter, please call our office to schedule the follow-up appointment.

## 2014-03-01 ENCOUNTER — Encounter: Payer: Self-pay | Admitting: Cardiology

## 2014-03-01 DIAGNOSIS — E1169 Type 2 diabetes mellitus with other specified complication: Secondary | ICD-10-CM

## 2014-03-01 DIAGNOSIS — E785 Hyperlipidemia, unspecified: Secondary | ICD-10-CM | POA: Insufficient documentation

## 2014-03-01 HISTORY — DX: Hyperlipidemia, unspecified: E11.69

## 2014-03-01 NOTE — Assessment & Plan Note (Signed)
Date his last visit we increased his Lipitor to 20 mg.  His most recent set of labs are pretty much at goal. His next a followup LAD should be a NMR panel to get a closer assessment of the actual lipid control.

## 2014-03-01 NOTE — Assessment & Plan Note (Addendum)
Thankfully, he has had no recurrent anginal symptoms since his PCI.  He continues to be very active with no symptoms. He is on an ACE inhibitor along with his statin and dual therapy. He is not on a beta blocker because of resting bradycardia.

## 2014-03-01 NOTE — Assessment & Plan Note (Signed)
He is now close to 2 years out from his PCI. I think we can try to switch him to Plavix from Effient. I wanted to have this is viable and, we can then have him start on Plavix. After 3 weeks on Plavix, we will check a P2Y12 assay to determine if he is a appropriate Plavix responder. If so we will continue on the Plavix alone without aspirin. Regardless he would be fine stopping these medications temporary for procedures.

## 2014-03-01 NOTE — Assessment & Plan Note (Signed)
With accommodation of hypertension, obesity, diabetes and dyslipidemia, he certainly has a higher risk. He continued to be on metformin along with statin.

## 2014-03-01 NOTE — Progress Notes (Addendum)
PATIENT: Kristopher Jackson MRN: [id number]  DOB: [birth year]   DOV:03/01/2014 PCP: Abigail Miyamoto, MD  Clinic Note: Chief Complaint  Patient presents with  . Annual Exam    no chest pain, no sob , occasional edema --labs 02/24/14     HPI: Kristopher Jackson is a 58 y.o.  male with a PMH below who presents today for annual followup of his CAD. He had significant exertional (class III) angina back in 2012. He was taken for cardiac catheterization which demonstrated a proximal LAD lesion that was treated with a drug-eluting stent. Since then, his denied any recurrence of these symptoms. I last saw him in March of 2014 and he was doing quite well..  Interval History: He presents today again doing very well without any complaints or concerns. 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 He stills says that during the course of the day he will walk somewhere between 10-15 miles. He denies any chest tightness or pressure with rest or exertion. No PND, orthopnea. No rapid or irregular heartbeats. No lightheadedness, dizziness or wooziness, no syncope/near syncope or TIA/amaurosis fugax symptoms. No melena hematochezia or dysuria. No significant bruising. No claudication.  Past Medical History  Diagnosis Date  . Hypertension   . Diabetes mellitus   . H/O class III angina pectoris 02/2012    - Referred for cardiac cath  . Coronary artery disease 02/09/2012    99% early Mid LAD (@Diag ); not on blocker due to bradycardia  . Presence of stent in LAD coronary artery - 02/09/2012     Integrity Resolute Drug Eluting Stent (3.0 mm x 26 mm -> 3.3 mm), mid LAD, 02/09/2012        . GERD (gastroesophageal reflux disease)   . H/O: pneumonia     Prior Cardiac Evaluation and Past Surgical History: Past Surgical History  Procedure Laterality Date  . Tonsillectomy    . Doppler echocardiography   02/07/2012    EF=>55% BODERLINE LVH,NORMAL DIASTOLIC FUNCTION  . Coronary angioplasty with stent placement  02/09/2012     80% LAD, (AT branch-point with major diagonal (D2) AND SEPTAL PERFORATOR (SP2) with a IntegrityResolute DES 3.0 mm x 26 mm; final diameter 3.3 mm proximal  & mid ; 3.1 mm distal     Allergies  Allergen Reactions  . Parafon Forte Dsc [Chlorzoxazone]   . Septra [Bactrim] Other (See Comments)    Found out in [employer]. Rash itch  . Chlorzoxazone Nausea And Vomiting    Current Outpatient Prescriptions  Medication Sig Dispense Refill  . aspirin EC 81 MG tablet Take 81 mg by mouth daily.      Marland Kitchen atorvastatin (LIPITOR) 20 MG tablet TAKE 1 TABLET ONCE DAILY.  30 tablet  8  . b complex vitamins tablet Take 1 tablet by mouth as needed.       Marland Kitchen lisinopril (PRINIVIL,ZESTRIL) 5 MG tablet TAKE 1 TABLET ONCE DAILY.  30 tablet  4  . metFORMIN (GLUCOPHAGE) 500 MG tablet Take 500 mg by mouth every morning.      . Multiple Vitamin (MULITIVITAMIN WITH MINERALS) TABS Take 1 tablet by mouth as needed.       . nitroGLYCERIN (NITROSTAT) 0.4 MG SL tablet Place 1 tablet (0.4 mg total) under the tongue every 5 (five) minutes as needed for chest pain.  25 tablet  6  . prasugrel (EFIENT) 10 MG tablet  Take 1 tablet (10 mg total) by mouth daily.  30 tablet  6  . nitroGLYCERIN (NITROSTAT) 0.4 MG SL tablet Place 1 tablet (0.4 mg total) under the tongue every 5 (five) minutes as needed for chest pain.  25 tablet  4   No current facility-administered medications for this visit.    History   Social History Narrative   Married father of one. Exercises routinely without significant symptoms. 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   He does not smoke.   He drinks up to 3 alcohol drinks a week.   ROS: A comprehensive Review of Systems - Negative except Mild musculoskeletal symptoms with soreness from doing different odd jobs  around the farm  PHYSICAL EXAM BP 138/70  Pulse 61  Ht 5\' 8"  (1.727 m)  Wt 220 lb 3.2 oz (99.882 kg)  BMI 33.49 kg/m2 General appearance: cooperative, appears stated age, no distress, mildly obese and Otherwise healthy-appearing. Normal mood and affect. Neck: no adenopathy, no carotid bruit, no JVD, supple, symmetrical, trachea midline and thyroid not enlarged, symmetric, no tenderness/mass/nodules Lungs: clear to auscultation bilaterally, normal percussion bilaterally and Nonlabored, good air movement Heart: regular rate and rhythm, S1, S2 normal, no murmur, click, rub or gallop and normal apical impulse Abdomen: soft, non-tender; bowel sounds normal; no masses,  no organomegaly and Truncal obesity Extremities: extremities normal, atraumatic, no cyanosis or edema Pulses: 2+ and symmetric Neurologic: Grossly normal   Adult ECG Report  Rate: 61 ;  Rhythm: normal sinus rhythm  QRS Axis: -46 ;  PR Interval: 146 ;  QRS Duration: 94 ; QTc: 408  Voltages: Normal  Conduction Disturbances: left anterior fascicular block  Other Abnormalities: none   Narrative Interpretation: Relatively normal EKG LAFB Recent Labs: Reviewed in Epic.- 02/24/2014  TC 106, TG 44, HDL 37, LDL 60. Chemistries and LFTs normal with the exception of glucose of 156  ASSESSMENT / PLAN: CAD (coronary artery disease), native coronary artery - 80% mid LAD - s/p PCI with DES with distal ~50%, 30-40% RCA & LCx (anomalous LCx takeoff) Thankfully, he has had no recurrent anginal symptoms since his PCI.  He continues to be very active with no symptoms. He is on an ACE inhibitor along with his statin and dual therapy. He is not on a beta blocker because of resting bradycardia.  Unstable angina pectoris - history of No recurrent symptoms post PCI  Presence of stent in LAD coronary artery - Integrity Resolute Drug Eluting Stent, mid LAD, 02/09/2012 He is now close to 2 years out from his PCI. I think we can try to switch him  to Plavix from Effient. I wanted to have this is viable and, we can then have him start on Plavix. After 3 weeks on Plavix, we will check a P2Y12 assay to determine if he is a appropriate Plavix responder. If so we will continue on the Plavix alone without aspirin. Regardless he would be fine stopping these medications temporary for procedures.  Borderline hypertension He continues to have some borderline blood pressures. He is only on low-dose lisinopril. At this time, not having any more changes as he is stable.  Dyslipidemia, goal LDL below 70 Date his last visit we increased his Lipitor to 20 mg.  His most recent set of labs are pretty much at goal. His next a followup LAD should be a NMR panel to get a closer assessment of the actual  lipid control.  Obesity (BMI 30.0-34.9) He continues to have a hard time losing weight and keeping it off. I again talked to him briefly about different dietary modification options. He is reluctant to try to see a nutritionist as the timing. He went to look into the DASH or Mediterranean diet options.  He is also going to pick up his walking routine with an effort to be more brisk with his walking  Metabolic syndrome With accommodation of hypertension, obesity, diabetes and dyslipidemia, he certainly has a higher risk. He continued to be on metformin along with statin.   Orders Placed This Encounter  Procedures  . Platelet inhibition p2y12  . EKG 12-Lead   Meds ordered this encounter  Medications  . DISCONTD: prasugrel (EFFIENT) 10 MG TABS tablet    Sig: Take 1 tablet (10 mg total) by mouth daily.    Dispense:  7 tablet    Refill:  0  . clopidogrel (PLAVIX) 75 MG tablet    Sig: Take 1 tablet (75 mg total) by mouth daily.    Dispense:  30 tablet    Refill:  6  . nitroGLYCERIN (NITROSTAT) 0.4 MG SL tablet    Sig: Place 1 tablet (0.4 mg total) under the tongue every 5 (five) minutes as needed for chest pain.    Dispense:  25 tablet    Refill:  6     Followup: One year  DAVID W. Herbie Baltimore, M.D., M.S. Interventional Cardiology [facility]

## 2014-03-01 NOTE — Assessment & Plan Note (Signed)
He continues to have some borderline blood pressures. He is only on low-dose lisinopril. At this time, not having any more changes as he is stable.

## 2014-03-01 NOTE — Assessment & Plan Note (Signed)
He continues to have a hard time losing weight and keeping it off. I again talked to him briefly about different dietary modification options. He is reluctant to try to see a nutritionist as the timing. He went to look into the DASH or Mediterranean diet options.  He is also going to pick up his walking routine with an effort to be more brisk with his walking

## 2014-03-01 NOTE — Assessment & Plan Note (Signed)
No recurrent symptoms post PCI

## 2014-03-04 ENCOUNTER — Telehealth: Payer: Self-pay | Admitting: *Deleted

## 2014-03-04 NOTE — Telephone Encounter (Signed)
Left message with family member to have patient to call back

## 2014-03-04 NOTE — Telephone Encounter (Signed)
Message copied by Tobin ChadMARTIN, Kewan Mcnease V. on Tue Mar 04, 2014 12:16 PM ------      Message from: Marykay LexHARDING, DAVID W      Created: Tue Feb 25, 2014 11:56 PM       Chemistries with the exception of glucose looked pretty good. Protein is just a little below which would suggest mild protein malnutrition.      HDL is a bit below normal, but the other labs look great. HDL is probably low because total cholesterol is low.            Continue current therapy.            Marykay LexHARDING,DAVID W, MD       ------

## 2014-03-05 ENCOUNTER — Encounter: Payer: Self-pay | Admitting: *Deleted

## 2014-03-05 NOTE — Telephone Encounter (Signed)
PATIENT RETURNED CALL .  Spoke to patient. Result given . Verbalized understanding WILL MAIL COPY OF LABS - PATIENT TAKE A COPY TO PRIMARY TO SEE GLUCOSE LEVEL

## 2014-03-05 NOTE — Telephone Encounter (Signed)
Returning your cal from yesterday. °

## 2014-03-05 NOTE — Telephone Encounter (Signed)
Left message to call back -test results 

## 2014-03-17 ENCOUNTER — Ambulatory Visit (HOSPITAL_COMMUNITY)
Admission: RE | Admit: 2014-03-17 | Discharge: 2014-03-17 | Disposition: A | Discharge status: Home / Self Care | Source: Ambulatory Visit | Attending: Cardiology | Admitting: Cardiology

## 2014-03-17 DIAGNOSIS — I2510 Atherosclerotic heart disease of native coronary artery without angina pectoris: Secondary | ICD-10-CM | POA: Diagnosis not present

## 2014-03-17 DIAGNOSIS — Z0189 Encounter for other specified special examinations: Secondary | ICD-10-CM | POA: Diagnosis present

## 2014-03-17 LAB — PLATELET INHIBITION P2Y12: PLATELET FUNCTION P2Y12: 143 [PRU] — AB (ref 194–418)

## 2014-03-18 ENCOUNTER — Telehealth: Payer: Self-pay | Admitting: *Deleted

## 2014-03-18 MED ORDER — CLOPIDOGREL BISULFATE 75 MG PO TABS: 75.0000 mg | ORAL_TABLET | Freq: Every day | ORAL | Status: DC

## 2014-03-18 NOTE — Telephone Encounter (Signed)
Left message to call back - concerning P2Y12

## 2014-03-18 NOTE — Telephone Encounter (Signed)
Patient returned call.RN informed patient that levels were okay and to continue Plavix  Per Dr Herbie BaltimoreHarding. Patient verbalized understanding. Rx sent into pharmacy for 90 day supply x 3 refill

## 2014-03-18 NOTE — Telephone Encounter (Signed)
Message copied by Tobin ChadMARTIN, Camren Henthorn V. on Tue Mar 18, 2014 10:56 AM ------      Message from: Marykay LexHARDING, DAVID W      Created: Tue Mar 18, 2014 12:30 AM       Level looks Good.      OK for Plavix.            Marykay Lexavid W Harding, MD       ------

## 2014-10-24 ENCOUNTER — Other Ambulatory Visit: Payer: Self-pay | Admitting: Cardiology

## 2014-10-24 NOTE — Telephone Encounter (Signed)
Rx was sent to pharmacy electronically. 

## 2014-11-13 ENCOUNTER — Encounter (HOSPITAL_COMMUNITY): Payer: Self-pay | Admitting: Cardiology

## 2015-05-06 ENCOUNTER — Other Ambulatory Visit: Payer: Self-pay | Admitting: Cardiology

## 2015-05-07 NOTE — Telephone Encounter (Signed)
Rx(s) sent to pharmacy electronically.  

## 2015-05-22 ENCOUNTER — Telehealth: Payer: Self-pay | Admitting: Cardiology

## 2015-05-22 DIAGNOSIS — E785 Hyperlipidemia, unspecified: Secondary | ICD-10-CM

## 2015-05-22 NOTE — Telephone Encounter (Signed)
Unable to reach pt or leave a message  

## 2015-05-22 NOTE — Telephone Encounter (Signed)
Would like an lab order sent to him before his appt on 08/06/15 . Thanks

## 2015-05-25 ENCOUNTER — Other Ambulatory Visit: Payer: Self-pay | Admitting: *Deleted

## 2015-05-25 DIAGNOSIS — R531 Weakness: Secondary | ICD-10-CM

## 2015-05-25 DIAGNOSIS — E785 Hyperlipidemia, unspecified: Secondary | ICD-10-CM

## 2015-05-25 DIAGNOSIS — Z79899 Other long term (current) drug therapy: Secondary | ICD-10-CM

## 2015-05-25 NOTE — Telephone Encounter (Signed)
Lab slips mailed to pt . As requested .

## 2015-05-29 ENCOUNTER — Other Ambulatory Visit: Payer: Self-pay | Admitting: Cardiology

## 2015-05-29 NOTE — Telephone Encounter (Signed)
Rx(s) sent to pharmacy electronically.  

## 2015-06-30 LAB — COMPLETE METABOLIC PANEL WITH GFR
ALT: 27 U/L (ref 9–46)
AST: 21 U/L (ref 10–35)
Albumin: 4.4 g/dL (ref 3.6–5.1)
Alkaline Phosphatase: 103 U/L (ref 40–115)
BILIRUBIN TOTAL: 0.7 mg/dL (ref 0.2–1.2)
BUN: 15 mg/dL (ref 7–25)
CHLORIDE: 105 meq/L (ref 98–110)
CO2: 25 meq/L (ref 20–31)
Calcium: 9.8 mg/dL (ref 8.6–10.3)
Creat: 0.99 mg/dL (ref 0.70–1.33)
GFR, EST NON AFRICAN AMERICAN: 83 mL/min (ref >=60)
GFR, Est African American: >89 mL/min (ref >=60)
Glucose, Bld: 175 mg/dL — ABNORMAL HIGH (ref 65–99)
Potassium: 4.9 mEq/L (ref 3.5–5.3)
Sodium: 143 mEq/L (ref 135–146)
Total Protein: 6.5 g/dL (ref 6.1–8.1)

## 2015-06-30 LAB — CBC
HCT: 40.9 % (ref 39.0–52.0)
HEMOGLOBIN: 13.8 g/dL (ref 13.0–17.0)
MCH: 29.2 pg (ref 26.0–34.0)
MCHC: 33.7 g/dL (ref 30.0–36.0)
MCV: 86.7 fL (ref 78.0–100.0)
MPV: 9.7 fL (ref 8.6–12.4)
Platelets: 194 10*3/uL (ref 150–400)
RBC: 4.72 MIL/uL (ref 4.22–5.81)
RDW: 14.1 % (ref 11.5–15.5)
WBC: 9.6 10*3/uL (ref 4.0–10.5)

## 2015-06-30 LAB — LIPID PANEL
Cholesterol: 171 mg/dL (ref 125–200)
HDL: 44 mg/dL (ref >=40)
LDL Cholesterol: 110 mg/dL (ref <130)
Total CHOL/HDL Ratio: 3.9 Ratio (ref <=5.0)
Triglycerides: 84 mg/dL (ref <150)
VLDL: 17 mg/dL (ref <30)

## 2015-07-13 ENCOUNTER — Telehealth: Payer: Self-pay | Admitting: *Deleted

## 2015-07-13 DIAGNOSIS — E8881 Metabolic syndrome: Secondary | ICD-10-CM

## 2015-07-13 DIAGNOSIS — E669 Obesity, unspecified: Secondary | ICD-10-CM

## 2015-07-13 DIAGNOSIS — I2510 Atherosclerotic heart disease of native coronary artery without angina pectoris: Secondary | ICD-10-CM

## 2015-07-13 DIAGNOSIS — Z79899 Other long term (current) drug therapy: Secondary | ICD-10-CM

## 2015-07-13 DIAGNOSIS — E66811 Obesity, class 1: Secondary | ICD-10-CM

## 2015-07-13 DIAGNOSIS — E785 Hyperlipidemia, unspecified: Secondary | ICD-10-CM

## 2015-07-13 NOTE — Telephone Encounter (Signed)
LEFT MESSAGE TO CALL BACK- REGARDING  LAB RESULTS 

## 2015-07-13 NOTE — Telephone Encounter (Signed)
-----   Message from Marykay Lex, MD sent at 07/08/2015  8:01 AM EDT ----- Unfortunately, total cholesterol and LDL gone up significantly. To increase his Lipitor to 40 mg and recheck in 3 months  HARDING, Piedad Climes, M.D., M.S. Interventional Cardiologist    Pager # [phone]  pls fwd to PCP - Abigail Miyamoto, MD

## 2015-07-21 MED ORDER — ATORVASTATIN CALCIUM 40 MG PO TABS: 40.0000 mg | ORAL_TABLET | Freq: Every day | ORAL | Status: DC

## 2015-07-21 NOTE — Telephone Encounter (Signed)
Spoke to patient. Result given . Verbalized understanding Increase atorvastatin 40 mg  Recheck in 3 months-will mail to patient Keep upcoming appointment 08/06/15

## 2015-08-06 ENCOUNTER — Ambulatory Visit (INDEPENDENT_AMBULATORY_CARE_PROVIDER_SITE_OTHER): Admitting: Cardiology

## 2015-08-06 ENCOUNTER — Encounter: Payer: Self-pay | Admitting: Cardiology

## 2015-08-06 VITALS — BP 162/84 | HR 63 | Ht 67.0 in | Wt 210.9 lb

## 2015-08-06 DIAGNOSIS — I200 Unstable angina: Secondary | ICD-10-CM

## 2015-08-06 DIAGNOSIS — R030 Elevated blood-pressure reading, without diagnosis of hypertension: Secondary | ICD-10-CM

## 2015-08-06 DIAGNOSIS — I2510 Atherosclerotic heart disease of native coronary artery without angina pectoris: Secondary | ICD-10-CM | POA: Diagnosis not present

## 2015-08-06 DIAGNOSIS — Z955 Presence of coronary angioplasty implant and graft: Secondary | ICD-10-CM | POA: Diagnosis not present

## 2015-08-06 DIAGNOSIS — E1169 Type 2 diabetes mellitus with other specified complication: Secondary | ICD-10-CM

## 2015-08-06 DIAGNOSIS — E66811 Obesity, class 1: Secondary | ICD-10-CM

## 2015-08-06 DIAGNOSIS — E119 Type 2 diabetes mellitus without complications: Secondary | ICD-10-CM

## 2015-08-06 DIAGNOSIS — I10 Essential (primary) hypertension: Secondary | ICD-10-CM | POA: Diagnosis not present

## 2015-08-06 DIAGNOSIS — E785 Hyperlipidemia, unspecified: Secondary | ICD-10-CM | POA: Diagnosis not present

## 2015-08-06 DIAGNOSIS — E669 Obesity, unspecified: Secondary | ICD-10-CM

## 2015-08-06 DIAGNOSIS — E8881 Metabolic syndrome: Secondary | ICD-10-CM

## 2015-08-06 MED ORDER — LISINOPRIL 20 MG PO TABS: 20.0000 mg | ORAL_TABLET | Freq: Every day | ORAL | Status: DC

## 2015-08-06 NOTE — Assessment & Plan Note (Signed)
wwith a diagnosis of hypertension, obesity, diabetes and dyslipidemia he has higher risk than the other one individually. Need to be persistent with glycemic control as well as blood pressure and lipid management. Diet an exercise continue to be the major focus.

## 2015-08-06 NOTE — Assessment & Plan Note (Addendum)
LDL was not at goal as of July labs. His Lipitor was increased to 40 mg. Will recheck in roughly 3 months along with a hemoglobin A1c since his glycemic control worse. I also talked about the importance of dietary modification and exercise. He is always active, so diet would be the major issue.

## 2015-08-06 NOTE — Assessment & Plan Note (Signed)
He is on ophthalmology is maintained on Plavix alone without aspirin. Would be okay to hold Plavix as necessary. He had a large bruise on falling and hitting his elbow.  I told him he can probably hold his Plavix for a couple days to allow this to heal.

## 2015-08-06 NOTE — Assessment & Plan Note (Signed)
His weight is down about 10 pounds since last visit. He said that his way to sort of an up-and-down  Over last year plus.  He is still trying to be conscientious of his diet and continues to be active.

## 2015-08-06 NOTE — Patient Instructions (Addendum)
Change to lisinopril 20 mg one tablet daily.  Will check labs in NOV 2016-lipid,cmp,hga1c Will mail you lab slip.  Please contact primary in dec 2016- after labs are drawn and blood pressure check   Your physician wants you to follow-up in 12  Months with Dr Herbie Baltimore.  You will receive a reminder letter in the mail two months in advance. If you don't receive a letter, please call our office to schedule the follow-up appointment.

## 2015-08-06 NOTE — Assessment & Plan Note (Signed)
His glycemic control on fasting lipid panel did not look very good. It was well over 150. I'm concerned that he is diabetes is not being adequately treated. He is only on metformin 500 mg twice a day. I will check hemoglobin A1c with his next followup lipid panel and asked that he followup with his primary care physician to reestablish alignment communications and monitor his levels/treatment based on his risk factors.

## 2015-08-06 NOTE — Assessment & Plan Note (Signed)
He is certainly declared himself to no longer have borderline blood pressure issues. He is hypertensive today. I think some of his exertional dyspnea may be related to increased afterload with some diastolic dysfunction. Not really noting PND orthopnea to suggest significant CHF hour.  Plan: Increase lisinopril to 20 mg daily

## 2015-08-06 NOTE — Assessment & Plan Note (Signed)
He had pretty classic exertional chest tightness and dyspnea combination for crescendo angina during his initial presentation. He still seems to remember the symptoms weren't has not had any recurrence of this.

## 2015-08-06 NOTE — Assessment & Plan Note (Addendum)
He is not having any active anginal symptoms however he is noticing exertional dyspnea. Is not  With routine activity, usually when he is somewhat heavy with exertion or walking a long distance. I am going to pay attention to this, but for now but we can probably just treat blood pressure and consider the likely be related to some diastolic dysfunction He is not having any chest tightness or pressure. Continue Plavix without aspirin, statin and ACE inhibitor which I will increase the dose up. He is not on beta blocker due to bradycardia in the past.  If his exertional dyspnea symptoms persist, would consider Myoview Stress Test

## 2015-08-06 NOTE — Progress Notes (Signed)
PCP: Abigail Miyamoto, MD  Clinic Note: Chief Complaint  Patient presents with  . VISIT    NO CHEST PAIN , SOB WALKING IN THE PASTURE, ANKLES AT THE END OF THE DAY.  Marland Kitchen Coronary Artery Disease    S/P PCI-LAD    HPI: Kristopher Jackson is a 59 y.o. male with a PMH below who presents today for CAD-PCI LAD for Class III Unstable Angina (02/2102). (Has anomalous Cx takeoff). Not on BB due to bradycardia & fatigue.  Treyvon Blahut was last seen in March 2015 - was doing well.  Recent Hospitalizations: n/a  Studies Reviewed: No new studies  Interval History: He seems to be doing pretty well.  His only notable Sx is that he is starting to notice having some exertional dyspnea [place]especially [place]  No recurrent Anginal Sx -- No chest pain / pressure with rest or exertion.  No PND, orthopnea -- mild sock-line swelling @ end of long day, goes down by AM..  No palpitations, lightheadedness, dizziness, weakness or syncope/near syncope. No TIA/amaurosis fugax symptoms.  Building barn for daughter's wedding.  Past Medical History  Diagnosis Date  . Hypertension   . Diabetes mellitus   . H/O class III angina pectoris 02/2012    - Referred for cardiac cath  . Coronary artery disease 02/09/2012    99% early Mid LAD (@Diag ); not on blocker due to bradycardia  . Presence of stent in LAD coronary artery - 02/09/2012     Integrity Resolute Drug Eluting Stent (3.0 mm x 26 mm -> 3.3 mm), mid LAD, 02/09/2012        . GERD (gastroesophageal reflux disease)   . H/O: pneumonia     Past Surgical History  Procedure Laterality Date  . Tonsillectomy    . Doppler echocardiography  02/07/2012    EF=>55% BODERLINE LVH,NORMAL DIASTOLIC FUNCTION  . Coronary angioplasty with stent placement  02/09/2012     80% LAD, (AT branch-point with major diagonal (D2) AND SEPTAL PERFORATOR (SP2) with a IntegrityResolute DES 3.0 mm x 26 mm; final diameter 3.3 mm proximal  & mid ; 3.1 mm distal     . Left heart catheterization with coronary angiogram N/A 02/09/2012    Procedure: LEFT HEART CATHETERIZATION WITH CORONARY ANGIOGRAM;  Surgeon: Marykay Lex, MD;  Location: [facility] CATH LAB;  Service: Cardiovascular;  Laterality: N/A;    ROS: A comprehensive was performed.  Recently fell off bed - hit elbow, large hematoma. Review of Systems  Constitutional: Negative for malaise/fatigue.  HENT: Negative for nosebleeds.   Eyes: Negative for blurred vision.  Respiratory: Negative for cough and shortness of breath.   Cardiovascular: Positive for leg swelling. Negative for claudication.  Gastrointestinal: Negative for blood in stool and melena.  Genitourinary: Negative for hematuria.  Musculoskeletal: Positive for joint pain (knee pain). Negative for myalgias.       L Elbow bruise  Neurological: Negative.  Negative for dizziness.  Endo/Heme/Allergies: Bruises/bleeds easily.  All other systems reviewed and are negative.    No melena, hematochezia, hematuria, or epstaxis. No claudication  Past Surgical History  Procedure Laterality Date  . Tonsillectomy    . Doppler echocardiography  02/07/2012    EF=>55% BODERLINE LVH,NORMAL DIASTOLIC FUNCTION  . Coronary angioplasty with stent placement  02/09/2012     80% LAD, (AT branch-point with major diagonal (D2) AND SEPTAL PERFORATOR (SP2) with a IntegrityResolute DES 3.0 mm x 26 mm; final diameter 3.3 mm proximal  & mid ;  3.1 mm distal   . Left heart catheterization with coronary angiogram N/A 02/09/2012    Procedure: LEFT HEART CATHETERIZATION WITH CORONARY ANGIOGRAM;  Surgeon: Marykay Lex, MD;  Location: [facility] CATH LAB;  Service: Cardiovascular;  Laterality: N/A;    Prior to Admission medications   Medication Sig Start Date End Date Taking? Authorizing Provider  aspirin EC 81 MG tablet Take 81 mg by mouth daily.    Historical Provider, MD  atorvastatin (LIPITOR) 40 MG tablet Take 1 tablet (40 mg total) by mouth daily. 07/21/15   Marykay Lex, MD   b complex vitamins tablet Take 1 tablet by mouth as needed.     Historical Provider, MD  clopidogrel (PLAVIX) 75 MG tablet Take 1 tablet (75 mg total) by mouth daily. MUST KEEP APPOINTMENT 08/06/2015 WITH DR Herbie Baltimore FOR FUTURE REFILLS 05/29/15   Marykay Lex, MD  lisinopril (PRINIVIL,ZESTRIL) 5 MG tablet TAKE 1 TABLET ONCE DAILY. 10/18/13   Marykay Lex, MD  metFORMIN (GLUCOPHAGE) 500 MG tablet Take 500 mg by mouth every morning.    Historical Provider, MD  Multiple Vitamin (MULITIVITAMIN WITH MINERALS) TABS Take 1 tablet by mouth as needed.     Historical Provider, MD  nitroGLYCERIN (NITROSTAT) 0.4 MG SL tablet Place 1 tablet (0.4 mg total) under the tongue every 5 (five) minutes as needed for chest pain. 02/10/12 02/09/13  Leone Brand, NP  nitroGLYCERIN (NITROSTAT) 0.4 MG SL tablet Place 1 tablet (0.4 mg total) under the tongue every 5 (five) minutes as needed for chest pain. 02/27/14   Marykay Lex, MD   Allergies  Allergen Reactions  . Parafon Forte Dsc [Chlorzoxazone]   . Septra [Bactrim] Other (See Comments)    Found out in [employer]. Rash itch  . Chlorzoxazone Nausea And Vomiting    Social History   Social History  . Marital Status: Married    Spouse Name: N/A  . Number of Children: N/A  . Years of Education: N/A   Social History Main Topics  . Smoking status: Never Smoker   . Smokeless tobacco: Never Used  . Alcohol Use: Yes     Comment: SOCIAL  . Drug Use: No  . Sexual Activity: Yes   Other Topics Concern  . None   Social History Narrative   Married father of one. Exercises routinely without significant symptoms. 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   He does not smoke.   He drinks up to 3 alcohol drinks a week.   Family History  Problem Relation Age of Onset  . Heart attack Father 75    Wt Readings from Last 3 Encounters:  08/06/15 210 lb 14.4 oz (95.664 kg)  02/27/14 220 lb 3.2  oz (99.882 kg)  02/10/12 216 lb 4.3 oz (98.1 kg)    PHYSICAL EXAM BP 162/84 mmHg  Pulse 63  Ht 5\' 7"  (1.702 m)  Wt 210 lb 14.4 oz (95.664 kg)  BMI 33.02 kg/m2 General appearance: cooperative, appears stated age, no distress, mildly obese and Otherwise healthy-appearing. Normal mood and affect. HEENT: [place]/AT, EOMI, MMM, anicteric sclera Neck: no adenopathy, no carotid bruit, no JVD, supple, symmetrical, trachea midline and thyroid not enlarged, symmetric, no tenderness/mass/nodules Lungs: clear to auscultation bilaterally, normal percussion bilaterally and Nonlabored, good air movement Heart: regular rate and rhythm, S1, S2 normal, no murmur, click, rub or gallop and normal apical impulse Abdomen: soft, non-tender; bowel sounds normal;  no masses, no organomegaly and Truncal obesity Extremities: extremities normal, atraumatic, no cyanosis or edema Pulses: 2+ and symmetric Neurologic: Grossly normal    Adult ECG Report  Rate: 63 ;  Rhythm: normal sinus rhythm and LAFB (-36);   Narrative Interpretation: Otherwise normal  Other studies Reviewed: Additional studies/ records that were reviewed today include:  Recent Labs:  Based on results of on lipids, his Lipitor dose had been increased to 40 mg last month Lab Results  Component Value Date   CHOL 171 06/30/2015   HDL 44 06/30/2015   LDLCALC 110 06/30/2015   TRIG 84 06/30/2015   CHOLHDL 3.9 06/30/2015   Lab Results  Component Value Date   CREATININE 0.99 06/30/2015   Lab Results  Component Value Date   K 4.9 06/30/2015    Lab Results  Component Value Date   WBC 9.6 06/30/2015   HGB 13.8 06/30/2015   HCT 40.9 06/30/2015   MCV 86.7 06/30/2015   PLT 194 06/30/2015    ASSESSMENT / PLAN: Problem List Items Addressed This Visit    CAD (coronary artery disease), native coronary artery - 80% mid LAD - s/p PCI with DES with distal ~50%, 30-40% RCA & LCx (anomalous LCx takeoff) (Chronic)    He is not having any active anginal  symptoms however he is noticing exertional dyspnea. Is not  With routine activity, usually when he is somewhat heavy with exertion or walking a long distance. I am going to pay attention to this, but for now but we can probably just treat blood pressure and consider the likely be related to some diastolic dysfunction He is not having any chest tightness or pressure. Continue Plavix without aspirin, statin and ACE inhibitor which I will increase the dose up. He is not on beta blocker due to bradycardia in the past.  If his exertional dyspnea symptoms persist, would consider Myoview Stress Test      Relevant Medications   lisinopril (PRINIVIL,ZESTRIL) 20 MG tablet   Other Relevant Orders   EKG 12-Lead   Hemoglobin A1c   Diabetes mellitus type 2 in obese (Chronic)    His glycemic control on fasting lipid panel did not look very good. It was well over 150. I'm concerned that he is diabetes is not being adequately treated. He is only on metformin 500 mg twice a day. I will check hemoglobin A1c with his next followup lipid panel and asked that he followup with his primary care physician to reestablish alignment communications and monitor his levels/treatment based on his risk factors.      Relevant Medications   lisinopril (PRINIVIL,ZESTRIL) 20 MG tablet   Other Relevant Orders   EKG 12-Lead   Hemoglobin A1c   Dyslipidemia, goal LDL below 70 (Chronic)    LDL was not at goal as of July labs. His Lipitor was increased to 40 mg. Will recheck in roughly 3 months along with a hemoglobin A1c since his glycemic control worse. I also talked about the importance of dietary modification and exercise. He is always active, so diet would be the major issue.      Relevant Medications   lisinopril (PRINIVIL,ZESTRIL) 20 MG tablet   Other Relevant Orders   EKG 12-Lead   Hemoglobin A1c   Essential hypertension - Primary (Chronic)    He is certainly declared himself to no longer have borderline blood pressure  issues. He is hypertensive today. I think some of his exertional dyspnea may be related to increased afterload with some diastolic  dysfunction. Not really noting PND orthopnea to suggest significant CHF hour.  Plan: Increase lisinopril to 20 mg daily      Relevant Medications   lisinopril (PRINIVIL,ZESTRIL) 20 MG tablet   Other Relevant Orders   EKG 12-Lead   Hemoglobin A1c   Metabolic syndrome (Chronic)    wwith a diagnosis of hypertension, obesity, diabetes and dyslipidemia he has higher risk than the other one individually. Need to be persistent with glycemic control as well as blood pressure and lipid management. Diet an exercise continue to be the major focus.      Relevant Orders   EKG 12-Lead   Hemoglobin A1c   Obesity (BMI 30.0-34.9) (Chronic)    His weight is down about 10 pounds since last visit. He said that his way to sort of an up-and-down  Over last year plus.  He is still trying to be conscientious of his diet and continues to be active.      Relevant Orders   EKG 12-Lead   Hemoglobin A1c   Presence of stent in LAD coronary artery - Integrity Resolute Drug Eluting Stent, mid LAD, 02/09/2012 (Chronic)    He is on ophthalmology is maintained on Plavix alone without aspirin. Would be okay to hold Plavix as necessary. He had a large bruise on falling and hitting his elbow.  I told him he can probably hold his Plavix for a couple days to allow this to heal.      Relevant Orders   EKG 12-Lead   Hemoglobin A1c   Unstable angina pectoris - history of (Chronic)    He had pretty classic exertional chest tightness and dyspnea combination for crescendo angina during his initial presentation. He still seems to remember the symptoms weren't has not had any recurrence of this.      Relevant Medications   lisinopril (PRINIVIL,ZESTRIL) 20 MG tablet   Other Relevant Orders   EKG 12-Lead   Hemoglobin A1c      Current medicines are reviewed at length with the patient today.  (+/- concerns) he has questions about what to do with significant bruising on Plavix.  Change to lisinopril 20 mg one tablet daily. Studies Ordered:  Will check labs in NOV 2016 Orders Placed This Encounter  Procedures  . Hemoglobin A1c  . EKG 12-Lead     Please contact primary in dec 2016- after labs are drawn and blood pressure check; need to f/u DM control   Your physician wants you to follow-up in 12  Months with Dr Herbie Baltimore.    Marykay Lex, M.D., M.S. Interventional Cardiologist   Pager # [phone]

## 2015-09-24 ENCOUNTER — Telehealth: Payer: Self-pay | Admitting: *Deleted

## 2015-09-24 DIAGNOSIS — I200 Unstable angina: Secondary | ICD-10-CM

## 2015-09-24 DIAGNOSIS — Z79899 Other long term (current) drug therapy: Secondary | ICD-10-CM

## 2015-09-24 DIAGNOSIS — E66811 Obesity, class 1: Secondary | ICD-10-CM

## 2015-09-24 DIAGNOSIS — E669 Obesity, unspecified: Secondary | ICD-10-CM

## 2015-09-24 DIAGNOSIS — E1169 Type 2 diabetes mellitus with other specified complication: Secondary | ICD-10-CM

## 2015-09-24 DIAGNOSIS — E8881 Metabolic syndrome: Secondary | ICD-10-CM

## 2015-09-24 DIAGNOSIS — I10 Essential (primary) hypertension: Secondary | ICD-10-CM

## 2015-09-24 DIAGNOSIS — E785 Hyperlipidemia, unspecified: Secondary | ICD-10-CM

## 2015-09-24 DIAGNOSIS — I2510 Atherosclerotic heart disease of native coronary artery without angina pectoris: Secondary | ICD-10-CM

## 2015-09-24 DIAGNOSIS — Z955 Presence of coronary angioplasty implant and graft: Secondary | ICD-10-CM

## 2015-09-24 NOTE — Telephone Encounter (Signed)
-----   Message from Tobin ChadSharon Abdurahman Rugg V, RN sent at 07/21/2015  4:24 PM EDT ----- Mail labs in oct 2016--lipid ,hepatic Due Oct 28 2015

## 2015-09-24 NOTE — Telephone Encounter (Signed)
Mail letter and labslip due 10/28/15

## 2015-09-24 NOTE — Telephone Encounter (Signed)
Open error 

## 2015-10-26 ENCOUNTER — Other Ambulatory Visit: Payer: Self-pay | Admitting: Cardiology

## 2016-04-28 ENCOUNTER — Other Ambulatory Visit: Payer: Self-pay | Admitting: Cardiology

## 2016-04-28 NOTE — Telephone Encounter (Signed)
Rx(s) sent to pharmacy electronically.  

## 2016-07-07 ENCOUNTER — Telehealth: Payer: Self-pay | Admitting: Cardiology

## 2016-07-07 NOTE — Telephone Encounter (Signed)
New message     The pt was wondering what his A1c was back in Feb 2017 cause he just had done at his primary care office

## 2016-07-07 NOTE — Telephone Encounter (Signed)
Returned patient's call. Informed him I cannot see any recent labwork. Informed him there were lab orders pending from October. Patient attests that he had something drawn in Feb. I checked for duplicate charting. Pt does inform me he received a call in February this year w lab results including the HgA1C. Informed him possibly there is an issue w our ability to view certain data on the chart related to recent EPIC update - there could be something else going on out of my scope. Pt aware I will send an inquiry to review this chart and see if there is some kind of technical issue which may be able to be resolved.  Pt voiced understanding and thanks.

## 2017-02-07 ENCOUNTER — Encounter: Payer: Self-pay | Admitting: Cardiology

## 2017-02-07 ENCOUNTER — Ambulatory Visit (INDEPENDENT_AMBULATORY_CARE_PROVIDER_SITE_OTHER): Admitting: Cardiology

## 2017-02-07 VITALS — BP 122/68 | HR 68 | Ht 67.0 in | Wt 200.0 lb

## 2017-02-07 DIAGNOSIS — E669 Obesity, unspecified: Secondary | ICD-10-CM | POA: Diagnosis not present

## 2017-02-07 DIAGNOSIS — I25110 Atherosclerotic heart disease of native coronary artery with unstable angina pectoris: Secondary | ICD-10-CM

## 2017-02-07 DIAGNOSIS — I200 Unstable angina: Secondary | ICD-10-CM | POA: Diagnosis not present

## 2017-02-07 DIAGNOSIS — E785 Hyperlipidemia, unspecified: Secondary | ICD-10-CM

## 2017-02-07 DIAGNOSIS — I10 Essential (primary) hypertension: Secondary | ICD-10-CM

## 2017-02-07 NOTE — Patient Instructions (Signed)
NO CHANGE WITH CURRENT TREATMENT     Your physician wants you to follow-up in 12 MONTHS WITH DR HARDING.You will receive a reminder letter in the mail two months in advance. If you don't receive a letter, please call our office to schedule the follow-up appointment.   If you need a refill on your cardiac medications before your next appointment, please call your pharmacy.

## 2017-02-07 NOTE — Progress Notes (Signed)
PCP: Abigail Miyamoto, MD  Clinic Note: Chief Complaint  Patient presents with  . Appointment    follow up. SHOB with activity. No swelling  . Coronary Artery Disease    HPI: Kristopher Jackson is a 60 y.o. male with a PMH below who presents today for 18 month follow-up for history of CAD-PCI to the LAD for class III unstable angina. Anomalous takeoff of the circumflex. Not on beta blocker due to bradycardia and fatigue.Kristopher Jackson was last seen on 08/06/2015  Recent Hospitalizations: None  Studies Reviewed: None  Interval History: Kristopher Jackson presents today doing well, really without any true complaints.  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 He has been trying to loose weight slowly.  He says that he will get SOB if he walks too far or too fast, but has not had any further anginal CP symptoms at rest or with exertion.  Cardiac Review of Sx:  No PND, orthopnea or edema.  No palpitations, lightheadedness, dizziness, weakness or syncope/near syncope. No TIA/amaurosis fugax symptoms. No melena, hematochezia, hematuria, or epstaxis. No claudication.  ROS: A comprehensive was performed. Review of Systems  Constitutional:       Recovering from flu ~2-3 weeks ago. - NO F/C & no longer malaise.  HENT: Negative for congestion and hearing loss.   Respiratory: Positive for cough (chronic cough - non-productive).   Cardiovascular: Positive for leg swelling (mild ankle swelling @ end of day). Negative for claudication.       Per HPI  Gastrointestinal: Positive for heartburn.  Musculoskeletal: Positive for joint pain (knees bother him). Negative for falls and myalgias.  Endo/Heme/Allergies: Positive for environmental allergies. Does not bruise/bleed easily (tends to bump into things @ work. - better w/o ASA).  Psychiatric/Behavioral: Negative.   All other systems reviewed and are negative.   Past Medical History:  Diagnosis Date  .  Coronary artery disease 02/09/2012   99% early Mid LAD (@Diag ); not on blocker due to bradycardia  . Diabetes mellitus   . GERD (gastroesophageal reflux disease)   . H/O class III angina pectoris 02/2012   - Referred for cardiac cath  . H/O: pneumonia   . Hypertension   . Presence of stent in LAD coronary artery - 02/09/2012    Integrity Resolute Drug Eluting Stent (3.0 mm x 26 mm -> 3.3 mm), mid LAD, 02/09/2012          Past Surgical History:  Procedure Laterality Date  . CORONARY ANGIOPLASTY WITH STENT PLACEMENT  02/09/2012    80% LAD, (AT branch-point with major diagonal (D2) AND SEPTAL PERFORATOR (SP2) with a IntegrityResolute DES 3.0 mm x 26 mm; final diameter 3.3 mm proximal  & mid ; 3.1 mm distal   . DOPPLER ECHOCARDIOGRAPHY  02/07/2012   EF=>55% BODERLINE LVH,NORMAL DIASTOLIC FUNCTION  . LEFT HEART CATHETERIZATION WITH CORONARY ANGIOGRAM N/A 02/09/2012   Procedure: LEFT HEART CATHETERIZATION WITH CORONARY ANGIOGRAM;  Surgeon: Marykay Lex, MD;  Location: [facility] CATH LAB;  Service: Cardiovascular;  Laterality: N/A;  . TONSILLECTOMY      Current Meds  Medication Sig  . atorvastatin (LIPITOR) 40 MG tablet TAKE 1 TABLET ONCE DAILY.  Marland Kitchen b complex vitamins tablet Take 1 tablet by mouth as needed.   . clopidogrel (PLAVIX) 75 MG tablet Take 1 tablet (75 mg total) by mouth daily.  Marland Kitchen JARDIANCE 25 MG TABS tablet Take 1 tablet by mouth daily.  Marland Kitchen lisinopril (PRINIVIL,ZESTRIL) 20 MG tablet  Take 1 tablet (20 mg total) by mouth daily.  . metFORMIN (GLUCOPHAGE) 500 MG tablet Take 500 mg by mouth every morning.  . Multiple Vitamin (MULITIVITAMIN WITH MINERALS) TABS Take 1 tablet by mouth as needed.   . nitroGLYCERIN (NITROSTAT) 0.4 MG SL tablet Place 1 tablet (0.4 mg total) under the tongue every 5 (five) minutes as needed for chest pain.  . pantoprazole (PROTONIX) 40 MG tablet Take 40 mg by mouth daily.    Allergies  Allergen Reactions  . Parafon Forte Dsc [Chlorzoxazone]   . Septra [Bactrim] Other  (See Comments)    Found out in [employer]. Rash itch  . Chlorzoxazone Nausea And Vomiting    Social History   Social History  . Marital status: Married    Spouse name: N/A  . Number of children: N/A  . Years of education: N/A   Social History Main Topics  . Smoking status: Never Smoker  . Smokeless tobacco: Never Used  . Alcohol use Yes     Comment: SOCIAL  . Drug use: No  . Sexual activity: Yes   Other Topics Concern  . None   Social History Narrative   Married father of one. Exercises routinely without significant symptoms. 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   He does not smoke.   He drinks up to 3 alcohol drinks a week.    family history includes Heart attack (age of onset: 26) in his father.  Wt Readings from Last 3 Encounters:  02/07/17 90.7 kg (200 lb)  08/06/15 95.7 kg (210 lb 14.4 oz)  02/27/14 99.9 kg (220 lb 3.2 oz)    PHYSICAL EXAM BP 122/68   Pulse 68   Ht 5\' 7"  (1.702 m)   Wt 90.7 kg (200 lb)   SpO2 96%   BMI 31.32 kg/m  General appearance: cooperative, appears stated age, no distress, mildly obese and Otherwise healthy-appearing. Normal mood and affect. HEENT: [place]/AT, EOMI, MMM, anicteric sclera Neck: no adenopathy, no carotid bruit, no JVD, supple, symmetrical, trachea midline and thyroid not enlarged, symmetric, no tenderness/mass/nodules Lungs: clear to auscultation bilaterally, normal percussion bilaterally and Nonlabored, good air movement Heart: regular rate and rhythm, S1 & S2 normal, no murmur, click, rub or gallop and normal apical impulse Abdomen: soft, non-tender; bowel sounds normal; no masses, no organomegaly and Truncal obesity Extremities: extremities normal, atraumatic, no cyanosis or edema Pulses: 2+ and symmetric Neurologic: Grossly normal    Adult ECG Report n/a   Other studies Reviewed: Additional studies/ records that were reviewed today  include:  Recent Labs:  Checked by PCP - not available     ASSESSMENT / PLAN: Problem List Items Addressed This Visit    CAD (coronary artery disease), native coronary artery - 80% mid LAD - s/p PCI with DES with distal ~50%, 30-40% RCA & LCx (anomalous LCx takeoff) - Primary (Chronic)    No active anginal pain.  + some DOE,but not worrisome to him. ? Related to deconditioning.  Doing well on Plavix alone along with statin & ACE-I. Not on Beta Blocker 2/2 h/o bradycardia on them in the past.      Dyslipidemia, goal LDL below 70 (Chronic)    Labs checked by PCP along with DM labs. -- On 40 mg atorvastatin --> hopefully with continued weight loss, his levels will improve.  I do not have recent labs.      Essential hypertension (Chronic)  Stable BP - on 20 mg Lisinopril.       Obesity (BMI 30.0-34.9) (Chronic)    Stable - slow & steady wgt loss -- congratulated his effors.      Relevant Medications   JARDIANCE 25 MG TABS tablet   Unstable angina pectoris - history of (Chronic)    His pre-PCI Sx was classic progressive anginal chest discomfort associated with dyspnea. ==> He has not had any further episodes since PCI.         Current medicines are reviewed at length with the patient today. (+/- concerns) n/a The following changes have been made: n/a  Patient Instructions  NO CHANGE WITH CURRENT TREATMENT     Your physician wants you to follow-up in 12 MONTHS WITH DR Albertine Lafoy.You will receive a reminder letter in the mail two months in advance. If you don't receive a letter, please call our office to schedule the follow-up appointment.   If you need a refill on your cardiac medications before your next appointment, please call your pharmacy.    Studies Ordered:   No orders of the defined types were placed in this encounter.     Kristopher Jackson, M.D., M.S. Interventional Cardiologist   Pager # [phone] Phone # [phone] [address] [place], [place] [address]

## 2017-02-09 ENCOUNTER — Encounter: Payer: Self-pay | Admitting: Cardiology

## 2017-02-09 NOTE — Assessment & Plan Note (Signed)
Stable - slow & steady wgt loss -- congratulated his effors.

## 2017-02-09 NOTE — Assessment & Plan Note (Signed)
Stable BP - on 20 mg Lisinopril.

## 2017-02-09 NOTE — Assessment & Plan Note (Signed)
His pre-PCI Sx was classic progressive anginal chest discomfort associated with dyspnea. ==> He has not had any further episodes since PCI.

## 2017-02-09 NOTE — Assessment & Plan Note (Signed)
Labs checked by PCP along with DM labs. -- On 40 mg atorvastatin --> hopefully with continued weight loss, his levels will improve.  I do not have recent labs.

## 2017-02-09 NOTE — Assessment & Plan Note (Signed)
No active anginal pain.  + some DOE,but not worrisome to him. ? Related to deconditioning.  Doing well on Plavix alone along with statin & ACE-I. Not on Beta Blocker 2/2 h/o bradycardia on them in the past.

## 2017-04-17 LAB — HM COLONOSCOPY

## 2017-08-28 ENCOUNTER — Telehealth: Payer: Self-pay | Admitting: Cardiology

## 2017-08-28 NOTE — Telephone Encounter (Signed)
New message    Patient did not want to disclose his specific reason for calling. States he wants to talk about his current "conditions". Patient asked about about medications, refills, pain; he states he only wanted to speak with nurse. Patient was offered appointment, he declined. Patient request call back.

## 2017-08-28 NOTE — Telephone Encounter (Signed)
[id number] 

## 2017-08-29 NOTE — Telephone Encounter (Signed)
OK ° °DH °

## 2017-08-29 NOTE — Telephone Encounter (Signed)
Follow up      Returning a call to the nurse.  Pt states he is feeling like he did before he got his stents 5 yrs ago.  Appt made with Dr Herbie Baltimore for tomorrow afternoon, however, pt still wanted to talk to the nurse

## 2017-08-29 NOTE — Telephone Encounter (Signed)
Spoke w patient. He notes he's been experiencing similar symptoms to what he had preceding his cath w/PCI C/o fatigue on exertion, dyspnea x 2 weeks.  He was able to schedule for tomorrow with Dr. Herbie Baltimore, states he doesn't need anything else and will come in as scheduled. Pt aware to seek emergency care if rapidly worsening or new symptoms. Aware I will send to Dr. Herbie Baltimore for [facility].

## 2017-08-30 ENCOUNTER — Encounter: Payer: Self-pay | Admitting: Cardiology

## 2017-08-30 ENCOUNTER — Ambulatory Visit (INDEPENDENT_AMBULATORY_CARE_PROVIDER_SITE_OTHER): Admitting: Cardiology

## 2017-08-30 VITALS — BP 100/78 | HR 84 | Ht 67.5 in | Wt 194.4 lb

## 2017-08-30 DIAGNOSIS — I2089 Other forms of angina pectoris: Secondary | ICD-10-CM | POA: Insufficient documentation

## 2017-08-30 DIAGNOSIS — I10 Essential (primary) hypertension: Secondary | ICD-10-CM | POA: Diagnosis not present

## 2017-08-30 DIAGNOSIS — E1169 Type 2 diabetes mellitus with other specified complication: Secondary | ICD-10-CM | POA: Diagnosis not present

## 2017-08-30 DIAGNOSIS — E669 Obesity, unspecified: Secondary | ICD-10-CM

## 2017-08-30 DIAGNOSIS — I208 Other forms of angina pectoris: Secondary | ICD-10-CM

## 2017-08-30 DIAGNOSIS — I200 Unstable angina: Secondary | ICD-10-CM

## 2017-08-30 DIAGNOSIS — I25118 Atherosclerotic heart disease of native coronary artery with other forms of angina pectoris: Secondary | ICD-10-CM

## 2017-08-30 DIAGNOSIS — E785 Hyperlipidemia, unspecified: Secondary | ICD-10-CM

## 2017-08-30 HISTORY — DX: Other forms of angina pectoris: I20.89

## 2017-08-30 HISTORY — DX: Other forms of angina pectoris: I20.8

## 2017-08-30 MED ORDER — LISINOPRIL 20 MG PO TABS: 10.0000 mg | ORAL_TABLET | Freq: Every day | ORAL | 3 refills | Status: DC

## 2017-08-30 NOTE — Patient Instructions (Signed)
MEDICATION CHANGE  DO NOT TAKE LISINOPRIL TOMORROW START THE NEXT DAY TAKING 1/2 TABLET OF 20 MG ( WILL BE 10 MG) DAILY  LABS DO NOT EAT OR DRINK THE MORNING OF THE TEST LIPID CMP    SCHEDULE [address] Your physician has requested that you have en exercise stress myoview. For further information please visit [email or web address] Please follow instruction sheet, as given.   Your physician recommends that you schedule a follow-up appointment in 1 MONTH WITH DR HARDING.

## 2017-09-01 ENCOUNTER — Encounter: Payer: Self-pay | Admitting: Cardiology

## 2017-09-01 NOTE — Assessment & Plan Note (Signed)
On metformin and Giardia is. He has lost a lot of weight with it and is having frequent urination. Is probably related to the Celedonio Miyamoto think he probably needs to increase his hydration. I'm worried that he may be a little dehydrated. Plan will be to reduce ACE inhibitor dose for now and recommend hydration.

## 2017-09-01 NOTE — Assessment & Plan Note (Signed)
Kristopher Jackson has definitely had atypical symptoms for angina and usually was not a classic chest tightness or pressure. Usually more associated with fatigue and dyspnea. Concerning feature here is that this is similar to his current symptoms. I'm a little bit concerned that he may be low on hypotensive side. This also could be related to recently starting a new diabetes medication.  For now been exclude ischemia with Myoview stress test to ensure that we are not missing ischemic symptoms.

## 2017-09-01 NOTE — Assessment & Plan Note (Signed)
The majority of his pre-PCI symptoms was the exertional dyspnea and maybe some mild anginal discomfort at the end. He is now starting to show signs of the original dyspnea symptom which were will evaluate with a Myoview stress test.

## 2017-09-01 NOTE — Assessment & Plan Note (Signed)
He is due for lab check anyway. Since I'm going to be checking a CMP along with a fasting lipid panel when he comes in for his Myoview.  He is on moderate dose atorvastatin.

## 2017-09-01 NOTE — Assessment & Plan Note (Signed)
Exertional dyspnea and fatigue was his main anginal equivalent at the time of his last intervention. He is now having similar symptoms. Since he has lost further weight and has been regulated active I don't think is really deconditioning related. He is on statin and Plavix. He is not on beta blocker because of bradycardia and fatigue. He is on ACE inhibitor which I reduced the dose of. Plan: Myoview (a possible Treadmill) to evaluate for ischemia but also exercise tolerance.

## 2017-09-01 NOTE — Assessment & Plan Note (Signed)
Doing really well now has lost 16 pounds in 2 years - 6 pounds since March.  Maybe related Giardia Kristopher Jackson may be related to increase activity. With his now having exertional fatigue, will check a Myoview to exclude ischemia. Otherwise I congratulated his efforts.

## 2017-09-01 NOTE — Assessment & Plan Note (Signed)
Interestingly, he is hypotensive today which is low but concerning. He has been started on charting and switch may be the reason why he is urinating quite a bit and he may be dehydrated. I've asked him to ensure he adequately hydrates, and I will have him hold lisinopril for tomorrow and then restart at half dose the next day.   I'm going to check a CMP along with his lipid panel to assess his renal function

## 2017-09-01 NOTE — Progress Notes (Signed)
PCP: Lillard Anes, MD  Clinic Note: Chief Complaint  Patient presents with  . Shortness of Breath    pt states being really SOB and no energy, and states urinating about every hour and a half   . Follow-up    vision seems blurry sometimes     HPI: Kristopher Jackson is a 61 y.o. male with a PMH below who presents today for Urgent work in evaluation for exertional dyspnea and fatigue. He has a past medical history of CAD-PCI to the LAD for class III unstable angina (with the symptom most notably being exertional dyspnea and fatigue) . Anomalous takeoff of the circumflex. Not on beta blocker due to bradycardia and fatigue.Kristopher Jackson was last seen on 02/07/2017, he was doing very well without any major issues. Very busy at work. No complaints.  Recent Hospitalizations: None  Studies Reviewed: None  Interval History: Kristopher Jackson presents today with complaints of about 2 weeks now of progressively worsening fatigue, exertional dyspnea that is beginning progressively worse now. He has also been noticing some dizziness getting out of the bed. He is outside try and do his activities at work and starts getting short of breath, most notably going up steps. He then will start kit and dizzy and feel as though he may pass out. He has not had true chest discomfort episodes, but he is definitely noted dyspnea and a general sense of fatigue with poor exercise tolerance. In the past he did not actually have much in the way of chest discomfort with his angina either. He does feel an uncomfortable sensation in his chest was having a hard time breathing however. He is noted some skipped beats and palpitations that are more frequent. These occur when he starts getting more exhausted than than usual.  He's not having heart failure symptoms of PND or orthopnea. No edema. He just states he just doesn't feel well..  Cardiac Review of Sx:   No syncope/near syncope. No TIA/amaurosis fugax symptoms. No  melena, hematochezia, hematuria, or epstaxis. No claudication.  ROS: A comprehensive was performed. Review of Systems  Constitutional: Positive for malaise/fatigue and weight loss (His weight is down from 210 pounds).       Recovering from flu ~2-3 weeks ago. - NO F/C & no longer malaise.  HENT: Negative for congestion and hearing loss.   Eyes: Positive for blurred vision.  Respiratory: Positive for shortness of breath. Negative for cough (chronic cough - non-productive).   Cardiovascular: Positive for leg swelling (mild ankle swelling @ end of day). Negative for claudication.       Per HPI  Gastrointestinal: Positive for heartburn.  Genitourinary: Positive for frequency.  Musculoskeletal: Positive for joint pain (knees bother him). Negative for falls and myalgias.  Neurological: Positive for dizziness and weakness (Generalized).  Endo/Heme/Allergies: Positive for environmental allergies. Does not bruise/bleed easily (tends to bump into things @ work. - better w/o ASA).  Psychiatric/Behavioral: Negative.   All other systems reviewed and are negative.   Past Medical History:  Diagnosis Date  . Coronary artery disease 02/09/2012   99% early Mid LAD (_0 ); not on blocker due to bradycardia  . Diabetes mellitus   . GERD (gastroesophageal reflux disease)   . H/O class III angina pectoris 02/2012   - Referred for cardiac cath  . H/O: pneumonia   . Hypertension   . Presence of stent in LAD coronary artery - 02/09/2012    Integrity Resolute Drug Eluting Stent (3.0 mm x 26 mm ->  3.3 mm), mid LAD, 02/09/2012          Past Surgical History:  Procedure Laterality Date  . CORONARY ANGIOPLASTY WITH STENT PLACEMENT  02/09/2012    80% LAD, (AT branch-point with major diagonal (D2) AND SEPTAL PERFORATOR (SP2) with a IntegrityResolute DES 3.0 mm x 26 mm; final diameter 3.3 mm proximal  & mid ; 3.1 mm distal   . DOPPLER ECHOCARDIOGRAPHY  02/07/2012   EF=>55% BODERLINE LVH,NORMAL DIASTOLIC FUNCTION  .  LEFT HEART CATHETERIZATION WITH CORONARY ANGIOGRAM N/A 02/09/2012   Procedure: LEFT HEART CATHETERIZATION WITH CORONARY ANGIOGRAM;  Surgeon: Leonie Man, MD;  Location: [facility] CATH LAB;  Service: Cardiovascular;  Laterality: N/A;  . TONSILLECTOMY      Current Meds  Medication Sig  . atorvastatin (LIPITOR) 40 MG tablet TAKE 1 TABLET ONCE DAILY.  Marland Kitchen clopidogrel (PLAVIX) 75 MG tablet Take 1 tablet (75 mg total) by mouth daily.  Marland Kitchen JARDIANCE 25 MG TABS tablet Take 1 tablet by mouth daily.  Marland Kitchen lisinopril (PRINIVIL,ZESTRIL) 20 MG tablet Take 0.5 tablets (10 mg total) by mouth daily.  . metFORMIN (GLUCOPHAGE) 500 MG tablet Take 500 mg by mouth every morning.  . nitroGLYCERIN (NITROSTAT) 0.4 MG SL tablet Place 1 tablet (0.4 mg total) under the tongue every 5 (five) minutes as needed for chest pain.  . pantoprazole (PROTONIX) 40 MG tablet Take 40 mg by mouth daily.  . [DISCONTINUED] lisinopril (PRINIVIL,ZESTRIL) 20 MG tablet Take 1 tablet (20 mg total) by mouth daily.    Allergies  Allergen Reactions  . Parafon Forte Dsc [Chlorzoxazone]   . Septra [Bactrim] Other (See Comments)    Found out in [employer] Rash itch  . Chlorzoxazone Nausea And Vomiting    Social History   Social History  . Marital status: Married    Spouse name: N/A  . Number of children: N/A  . Years of education: N/A   Social History Main Topics  . Smoking status: Never Smoker  . Smokeless tobacco: Never Used  . Alcohol use Yes     Comment: SOCIAL  . Drug use: No  . Sexual activity: Yes   Other Topics Concern  . None   Social History Narrative   Married father of one. Exercises routinely without significant symptoms. 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   He does not smoke.   He drinks up to 3 alcohol drinks a week.    family history includes Heart attack (age of onset: 42) in his father.  Wt Readings from Last 3 Encounters:  08/30/17  194 lb 6.4 oz (88.2 kg)  02/07/17 200 lb (90.7 kg)  08/06/15 210 lb 14.4 oz (95.7 kg)    PHYSICAL EXAM BP 100/78 (BP Location: Right Arm, Patient Position: Sitting, Cuff Size: Normal)   Pulse 84   Ht 5' 7.5" (1.715 m)   Wt 194 lb 6.4 oz (88.2 kg)   BMI 30.00 kg/m   Physical Exam  Constitutional: He is oriented to person, place, and time. He appears well-developed and well-nourished. No distress (Not really distressed, but just somewhat tired appearing).  Borderline obese. Well groomed  HENT:  Head: Normocephalic and atraumatic.  Eyes: Pupils are equal, round, and reactive to light. EOM are normal. No scleral icterus.  Neck: Normal range of motion. Neck supple. No hepatojugular reflux present. Carotid bruit is not present.  Cardiovascular: Normal rate, regular rhythm, normal heart sounds and normal pulses.  Occasional extrasystoles are present. PMI is not displaced.  Exam reveals no gallop.   No murmur heard. Pulmonary/Chest: Effort normal and breath sounds normal. No respiratory distress. He has no wheezes. He has no rales. He exhibits no tenderness.  Abdominal: Soft. Bowel sounds are normal. He exhibits no distension. There is no tenderness. There is no rebound.  Musculoskeletal: Normal range of motion. He exhibits no edema or deformity.  Neurological: He is alert and oriented to person, place, and time. No cranial nerve deficit.  Skin: Skin is warm and dry.  Psychiatric: He has a normal mood and affect. His behavior is normal. Judgment and thought content normal.  Nursing note and vitals reviewed.   Adult ECG Report NSR: 84 bpm. PVC. 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 Usually more associated with fatigue and dyspnea. Concerning feature here is that this is similar to his current symptoms. I'm a little bit concerned that he may be low on hypotensive side. This also could be related to recently starting a new diabetes medication.  For now been exclude ischemia with Myoview stress test to ensure that we are not missing ischemic symptoms.      Relevant Medications   lisinopril (PRINIVIL,ZESTRIL) 20 MG tablet   Other Relevant Orders   MYOCARDIAL PERFUSION IMAGING   EKG 12-Lead   CAD (coronary artery disease), native coronary artery - 80% mid LAD - s/p PCI with DES with distal ~50%, 30-40% RCA & LCx (anomalous LCx takeoff) (Chronic)    Exertional dyspnea and fatigue was his main anginal equivalent at the time of his last intervention. He is now having similar symptoms. Since he has lost further weight and has been regulated active I don't think is really deconditioning related. He is on statin and Plavix. He is not on beta blocker because of bradycardia and fatigue. He is on ACE inhibitor which I reduced the dose of. Plan: Myoview (a possible Treadmill) to evaluate for ischemia but also exercise tolerance.      Relevant Medications   lisinopril (PRINIVIL,ZESTRIL) 20 MG tablet   Other Relevant Orders   Comprehensive metabolic panel   MYOCARDIAL PERFUSION IMAGING   EKG 12-Lead   Diabetes mellitus type 2 in obese (HCC) (Chronic)    On metformin and Giardia is. He has lost a lot of weight with it and is having frequent urination. Is probably related to the Christell Constant think he probably needs to increase his hydration. I'm worried that he may be a little dehydrated. Plan will be to reduce ACE inhibitor dose for now and recommend hydration.      Relevant Medications   lisinopril (PRINIVIL,ZESTRIL) 20 MG  tablet   Dyslipidemia, goal LDL below 70 (Chronic)    He is due for lab check anyway. Since I'm going to be checking a CMP along with a fasting lipid panel when he comes in for his Myoview.  He is on moderate dose atorvastatin.      Relevant Medications   lisinopril (PRINIVIL,ZESTRIL) 20 MG tablet   Other  Relevant Orders   Lipid panel   Comprehensive metabolic panel   MYOCARDIAL PERFUSION IMAGING   Essential hypertension (Chronic)    Interestingly, he is hypotensive today which is low but concerning. He has been started on charting and switch may be the reason why he is urinating quite a bit and he may be dehydrated. I've asked him to ensure he adequately hydrates, and I will have him hold lisinopril for tomorrow and then restart at half dose the next day.   I'm going to check a CMP along with his lipid panel to assess his renal function       Relevant Medications   lisinopril (PRINIVIL,ZESTRIL) 20 MG tablet   Obesity (BMI 30.0-34.9) (Chronic)    Doing really well now has lost 16 pounds in 2 years - 6 pounds since March.  Maybe related Giardia Nicki Reaper may be related to increase activity. With his now having exertional fatigue, will check a Myoview to exclude ischemia. Otherwise I congratulated his efforts.       Unstable angina pectoris - history of (Chronic)    The majority of his pre-PCI symptoms was the exertional dyspnea and maybe some mild anginal discomfort at the end. He is now starting to show signs of the original dyspnea symptom which were will evaluate with a Myoview stress test.      Relevant Medications   lisinopril (PRINIVIL,ZESTRIL) 20 MG tablet      Current medicines are reviewed at length with the patient today. (+/- concerns) n/a The following changes have been made: n/a  Patient Instructions  MEDICATION CHANGE  DO NOT TAKE LISINOPRIL TOMORROW START THE NEXT DAY TAKING 1/2 TABLET OF 20 MG ( WILL BE 10 MG) DAILY  LABS DO NOT EAT OR DRINK THE MORNING OF  THE TEST LIPID CMP    SCHEDULE [address] Your physician has requested that you have en exercise stress myoview. For further information please visit [email or web address]. Please follow instruction sheet, as given.   Your physician recommends that you schedule a follow-up appointment in [place].     Studies Ordered:   Orders Placed This Encounter  Procedures  . Lipid panel  . Comprehensive metabolic panel  . EKG 12-Lead  . MYOCARDIAL PERFUSION IMAGING      Glenetta Hew, M.D., M.S. Interventional Cardiologist   Pager # [phone] Phone # [phone] [address] [place], [place] [address]

## 2017-09-04 ENCOUNTER — Other Ambulatory Visit: Payer: Self-pay | Admitting: Cardiology

## 2017-09-04 DIAGNOSIS — I208 Other forms of angina pectoris: Secondary | ICD-10-CM

## 2017-09-04 DIAGNOSIS — I2089 Other forms of angina pectoris: Secondary | ICD-10-CM

## 2017-09-04 DIAGNOSIS — E785 Hyperlipidemia, unspecified: Secondary | ICD-10-CM

## 2017-09-04 DIAGNOSIS — I25119 Atherosclerotic heart disease of native coronary artery with unspecified angina pectoris: Secondary | ICD-10-CM

## 2017-09-05 ENCOUNTER — Telehealth (HOSPITAL_COMMUNITY): Payer: Self-pay

## 2017-09-05 NOTE — Telephone Encounter (Signed)
Encounter complete. 

## 2017-09-06 ENCOUNTER — Telehealth (HOSPITAL_COMMUNITY): Payer: Self-pay

## 2017-09-06 NOTE — Telephone Encounter (Signed)
Encounter complete. 

## 2017-09-07 ENCOUNTER — Ambulatory Visit (HOSPITAL_COMMUNITY)
Admission: RE | Admit: 2017-09-07 | Discharge: 2017-09-07 | Disposition: A | Discharge status: Home / Self Care | Source: Ambulatory Visit | Attending: Cardiology | Admitting: Cardiology

## 2017-09-07 DIAGNOSIS — E669 Obesity, unspecified: Secondary | ICD-10-CM | POA: Diagnosis not present

## 2017-09-07 DIAGNOSIS — I10 Essential (primary) hypertension: Secondary | ICD-10-CM | POA: Diagnosis not present

## 2017-09-07 DIAGNOSIS — Z955 Presence of coronary angioplasty implant and graft: Secondary | ICD-10-CM | POA: Diagnosis not present

## 2017-09-07 DIAGNOSIS — I208 Other forms of angina pectoris: Secondary | ICD-10-CM | POA: Diagnosis not present

## 2017-09-07 DIAGNOSIS — Z8249 Family history of ischemic heart disease and other diseases of the circulatory system: Secondary | ICD-10-CM | POA: Insufficient documentation

## 2017-09-07 DIAGNOSIS — I25119 Atherosclerotic heart disease of native coronary artery with unspecified angina pectoris: Secondary | ICD-10-CM | POA: Insufficient documentation

## 2017-09-07 DIAGNOSIS — E119 Type 2 diabetes mellitus without complications: Secondary | ICD-10-CM | POA: Diagnosis not present

## 2017-09-07 DIAGNOSIS — E785 Hyperlipidemia, unspecified: Secondary | ICD-10-CM | POA: Insufficient documentation

## 2017-09-07 LAB — MYOCARDIAL PERFUSION IMAGING
CHL CUP NUCLEAR SDS: 3
Estimated workload: 10.4 METS
Exercise duration (min): 8 min
Exercise duration (sec): 35 s
LV sys vol: 40 mL
LVDIAVOL: 81 mL (ref 62–150)
MPHR: 159 {beats}/min
NUC STRESS TID: 1.19
Peak HR: 141 {beats}/min
Percent HR: 88 %
RPE: 18
Rest HR: 49 {beats}/min
SRS: 1
SSS: 4

## 2017-09-07 LAB — LIPID PANEL
CHOL/HDL RATIO: 3.5 ratio (ref 0.0–5.0)
CHOLESTEROL TOTAL: 142 mg/dL (ref 100–199)
HDL: 41 mg/dL (ref >39)
LDL Calculated: 72 mg/dL (ref 0–99)
TRIGLYCERIDES: 147 mg/dL (ref 0–149)
VLDL Cholesterol Cal: 29 mg/dL (ref 5–40)

## 2017-09-07 LAB — COMPREHENSIVE METABOLIC PANEL
A/G RATIO: 2.6 — AB (ref 1.2–2.2)
ALBUMIN: 4.5 g/dL (ref 3.6–4.8)
ALK PHOS: 150 IU/L — AB (ref 39–117)
ALT: 55 IU/L — ABNORMAL HIGH (ref 0–44)
AST: 42 IU/L — ABNORMAL HIGH (ref 0–40)
BUN / CREAT RATIO: 11 (ref 10–24)
BUN: 14 mg/dL (ref 8–27)
Bilirubin Total: 0.6 mg/dL (ref 0.0–1.2)
CALCIUM: 9.5 mg/dL (ref 8.6–10.2)
CO2: 22 mmol/L (ref 20–29)
CREATININE: 1.27 mg/dL (ref 0.76–1.27)
Chloride: 97 mmol/L (ref 96–106)
GFR calc Af Amer: 70 mL/min/{1.73_m2} (ref >59)
GFR, EST NON AFRICAN AMERICAN: 61 mL/min/{1.73_m2} (ref >59)
GLOBULIN, TOTAL: 1.7 g/dL (ref 1.5–4.5)
Glucose: 326 mg/dL — ABNORMAL HIGH (ref 65–99)
POTASSIUM: 4.7 mmol/L (ref 3.5–5.2)
SODIUM: 137 mmol/L (ref 134–144)
Total Protein: 6.2 g/dL (ref 6.0–8.5)

## 2017-09-07 MED ORDER — TECHNETIUM TC 99M TETROFOSMIN IV KIT
10.8000 | PACK | Freq: Once | INTRAVENOUS | Status: AC | PRN
  Administered 2017-09-07: 10.8 via INTRAVENOUS
  Filled 2017-09-07: qty 11

## 2017-09-07 MED ORDER — TECHNETIUM TC 99M TETROFOSMIN IV KIT
30.2000 | PACK | Freq: Once | INTRAVENOUS | Status: AC | PRN
  Administered 2017-09-07: 30.2 via INTRAVENOUS
  Filled 2017-09-07: qty 31

## 2017-09-07 NOTE — Progress Notes (Signed)
Stress Test looked good!! No sign of significant Heart Artery Disease.  Pump function is normal.  Good news!!.  Eustacio Ellen W, MD 

## 2017-09-12 ENCOUNTER — Telehealth: Payer: Self-pay | Admitting: Cardiology

## 2017-09-12 ENCOUNTER — Other Ambulatory Visit: Payer: Self-pay

## 2017-09-12 DIAGNOSIS — Z79899 Other long term (current) drug therapy: Secondary | ICD-10-CM

## 2017-09-12 NOTE — Telephone Encounter (Signed)
New Message     Pt was calling to get his myocardial perfusion results

## 2017-09-12 NOTE — Telephone Encounter (Signed)
See result note pt notified repeat lab ordered

## 2017-10-10 ENCOUNTER — Ambulatory Visit (INDEPENDENT_AMBULATORY_CARE_PROVIDER_SITE_OTHER): Admitting: Cardiology

## 2017-10-10 ENCOUNTER — Encounter: Payer: Self-pay | Admitting: Cardiology

## 2017-10-10 VITALS — BP 136/80 | HR 72 | Ht 68.0 in | Wt 201.0 lb

## 2017-10-10 DIAGNOSIS — I25110 Atherosclerotic heart disease of native coronary artery with unstable angina pectoris: Secondary | ICD-10-CM

## 2017-10-10 DIAGNOSIS — I10 Essential (primary) hypertension: Secondary | ICD-10-CM | POA: Diagnosis not present

## 2017-10-10 DIAGNOSIS — E785 Hyperlipidemia, unspecified: Secondary | ICD-10-CM | POA: Diagnosis not present

## 2017-10-10 DIAGNOSIS — E669 Obesity, unspecified: Secondary | ICD-10-CM | POA: Diagnosis not present

## 2017-10-10 DIAGNOSIS — R945 Abnormal results of liver function studies: Secondary | ICD-10-CM | POA: Diagnosis not present

## 2017-10-10 DIAGNOSIS — R7989 Other specified abnormal findings of blood chemistry: Secondary | ICD-10-CM

## 2017-10-10 LAB — HEPATIC FUNCTION PANEL
ALK PHOS: 97 IU/L (ref 39–117)
ALT: 45 IU/L — ABNORMAL HIGH (ref 0–44)
AST: 28 IU/L (ref 0–40)
Albumin: 4.3 g/dL (ref 3.6–4.8)
BILIRUBIN TOTAL: 0.6 mg/dL (ref 0.0–1.2)
BILIRUBIN, DIRECT: 0.16 mg/dL (ref 0.00–0.40)
TOTAL PROTEIN: 5.8 g/dL — AB (ref 6.0–8.5)

## 2017-10-10 NOTE — Assessment & Plan Note (Signed)
LFTs from October 4 were abnormal.  We will recheck today.  If back to normal, will suspect that it may be related to whatever systemic illness was going on at that time.  We can then restart atorvastatin --> recheck LFTs after 2 months back on atorvastatin.

## 2017-10-10 NOTE — Assessment & Plan Note (Signed)
He actually put back on some weight since his last visit. Remains active.  I suspect he is just feeling better overall.

## 2017-10-10 NOTE — Progress Notes (Signed)
PCP: Lillard Anes, MD  Clinic Note: Chief Complaint  Patient presents with  . Follow-up    test results; abnormal LFTs  . Coronary Artery Disease    Follow-up Myoview    HPI: Kristopher Jackson is a 62 y.o. male with a PMH below who presents today for one-month follow-up exertional dyspnea and fatigue. He has a past medical history of CAD-PCI to the LAD for class III unstable angina (with the symptom most notably being exertional dyspnea and fatigue) . Anomalous takeoff of the circumflex. Not on beta blocker due to bradycardia and fatigue.Kristopher Jackson was last seen on 08/30/2017, he was doing very well without any major issues. Very busy at work. No complaints.  Recent Hospitalizations: None  Studies Reviewed:   Exercise Myoview October 2018: Exercise 8:35 min, peak HR 141 bpm (88% MPHR).  Hypertensive response with blood pressure going from 107/78 up to 158/117 mmHg.  10.4 METs.   Stopped due to fatigue and weakness.  Also noted shortness of breath.  No ischemia or infarct noted.  Low normal EF.  Interval History: Kristopher Jackson presents today feeling much better than he did the last time he was here.  He is dealing with a cold, but now is no longer dealing with the fatigue and dyspnea that he was having.  His blood sugars have been much better controlled.  His weight is stabilized, and he is pretty much back to his baseline.  Exercise tolerance has improved.  Denies any anginal chest discomfort with rest or exertion.  No resting or exertional dyspnea.  Palpitations have improved as well.  No lightheadedness, dizziness or syncope/near syncope.  No heart failure symptoms of PND, orthopnea, but still has end of day edema.   Cardiac Review of Sx:  No syncope/near syncope, TIA/amaurosis fugax symptoms.  No claudication.  ROS: A comprehensive was performed. Review of Systems  Constitutional: Negative for fever, malaise/fatigue (Currently has a cold) and weight loss (His weight is down  from 210 pounds).       Recovering from flu ~2-3 weeks ago. - NO F/C & no longer malaise.  HENT: Positive for sore throat. Negative for congestion and hearing loss.   Eyes: Positive for blurred vision.  Respiratory: Positive for cough (chronic cough - non-productive). Negative for sputum production and shortness of breath.   Cardiovascular: Positive for leg swelling (mild ankle swelling @ end of day). Negative for claudication.       Per HPI  Gastrointestinal: Positive for heartburn. Negative for blood in stool and melena.  Genitourinary: Negative for frequency.  Musculoskeletal: Positive for joint pain (knees bother him). Negative for falls and myalgias.  Neurological: Negative for dizziness and weakness (Generalized).  Endo/Heme/Allergies: Positive for environmental allergies. Does not bruise/bleed easily (tends to bump into things @ work. - better w/o ASA).  Psychiatric/Behavioral: Negative.   All other systems reviewed and are negative.   Past Medical History:  Diagnosis Date  . Coronary artery disease 02/09/2012   99% early Mid LAD (@Diag ); not on blocker due to bradycardia  . Diabetes mellitus   . GERD (gastroesophageal reflux disease)   . H/O class III angina pectoris 02/2012   - Referred for cardiac cath  . H/O: pneumonia   . Hypertension   . Presence of stent in LAD coronary artery - 02/09/2012    Integrity Resolute Drug Eluting Stent (3.0 mm x 26 mm -> 3.3 mm), mid LAD, 02/09/2012          Past Surgical  History:  Procedure Laterality Date  . CORONARY ANGIOPLASTY WITH STENT PLACEMENT  02/09/2012    80% LAD, (AT branch-point with major diagonal (D2) AND SEPTAL PERFORATOR (SP2) with a IntegrityResolute DES 3.0 mm x 26 mm; final diameter 3.3 mm proximal  & mid ; 3.1 mm distal   . DOPPLER ECHOCARDIOGRAPHY  02/07/2012   EF=>55% BODERLINE LVH,NORMAL DIASTOLIC FUNCTION  . TONSILLECTOMY      Current Meds  Medication Sig  . clopidogrel (PLAVIX) 75 MG tablet Take 1 tablet (75 mg total)  by mouth daily.  Marland Kitchen JARDIANCE 25 MG TABS tablet Take 1 tablet by mouth daily.  Marland Kitchen lisinopril (PRINIVIL,ZESTRIL) 20 MG tablet Take 0.5 tablets (10 mg total) by mouth daily.  . metFORMIN (GLUCOPHAGE) 500 MG tablet Take 500 mg 2 (two) times daily with a meal by mouth.   . nitroGLYCERIN (NITROSTAT) 0.4 MG SL tablet Place 1 tablet (0.4 mg total) under the tongue every 5 (five) minutes as needed for chest pain.  . pantoprazole (PROTONIX) 40 MG tablet Take 40 mg by mouth daily.    Allergies  Allergen Reactions  . Parafon Forte Dsc [Chlorzoxazone]   . Chlorzoxazone Nausea And Vomiting  . Septra [Bactrim] Rash and Other (See Comments)    Found out in [employer]. Rash itch    Social History   Socioeconomic History  . Marital status: Married    Spouse name: None  . Number of children: None  . Years of education: None  . Highest education level: None  Social Needs  . Financial resource strain: None  . Food insecurity - worry: None  . Food insecurity - inability: None  . Transportation needs - medical: None  . Transportation needs - non-medical: None  Occupational History  . None  Tobacco Use  . Smoking status: Never Smoker  . Smokeless tobacco: Never Used  Substance and Sexual Activity  . Alcohol use: Yes    Comment: SOCIAL  . Drug use: No  . Sexual activity: Yes  Other Topics Concern  . None  Social History Narrative   Married father of one. Exercises routinely without significant symptoms. 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   He does not smoke.   He drinks up to 3 alcohol drinks a week.    family history includes Heart attack (age of onset: 73) in his father.  Wt Readings from Last 3 Encounters:  10/10/17 201 lb (91.2 kg)  09/07/17 194 lb (88 kg)  08/30/17 194 lb 6.4 oz (88.2 kg)    PHYSICAL EXAM BP 136/80   Pulse 72   Ht 5' 8"  (1.727 m)   Wt 201 lb (91.2 kg)   BMI 30.56 kg/m   Physical  Exam  Constitutional: He is oriented to person, place, and time. He appears well-developed and well-nourished. No distress.  Borderline obese. Well groomed  HENT:  Head: Normocephalic and atraumatic.  Mouth/Throat: No oropharyngeal exudate.  Neck: Normal range of motion. Neck supple. No hepatojugular reflux and no JVD present. Carotid bruit is not present.  Cardiovascular: Normal rate, regular rhythm, normal heart sounds and normal pulses.  No extrasystoles are present. PMI is not displaced. Exam reveals no gallop.  No murmur heard. Pulmonary/Chest: Effort normal and breath sounds normal. No respiratory distress. He has no wheezes. He has no rales. He exhibits no tenderness.  Abdominal: Soft. Bowel sounds are normal. He exhibits no distension. There is no tenderness. There is  no rebound.  Musculoskeletal: Normal range of motion. He exhibits no edema.  Neurological: He is alert and oriented to person, place, and time. No cranial nerve deficit.  Psychiatric: He has a normal mood and affect. His behavior is normal. Judgment and thought content normal.  Nursing note and vitals reviewed.   Adult ECG Report n/a  Other studies Reviewed: Additional studies/ records that were reviewed today include:  Recent Labs:  Checked by PCP - not available   Lab Results  Component Value Date   CHOL 142 09/07/2017   HDL 41 09/07/2017   LDLCALC 72 09/07/2017   TRIG 147 09/07/2017   CHOLHDL 3.5 09/07/2017   Lab Results  Component Value Date   ALT 55 (H) 09/07/2017   AST 42 (H) 09/07/2017   ALKPHOS 150 (H) 09/07/2017   BILITOT 0.6 09/07/2017   He was contacted with these results and told to hold his atorvastatin.   ASSESSMENT / PLAN: Problem List Items Addressed This Visit    Abnormal LFTs    LFTs from October 4 were abnormal.  We will recheck today.  If back to normal, will suspect that it may be related to whatever systemic illness was going on at that time.  We can then restart atorvastatin -->  recheck LFTs after 2 months back on atorvastatin.      Relevant Orders   Hepatic function panel   CAD (coronary artery disease), native coronary artery - 80% mid LAD - s/p PCI with DES with distal ~50%, 30-40% RCA & LCx (anomalous LCx takeoff) (Chronic)    Thankfully, his exertional dyspnea and fatigue is improved and he is not having any symptoms concerning for angina at this point.  Myoview stress test was negative for ischemia, and he was able to go for 10 MET. Continue Plavix without aspirin.  Not on beta-blocker for fatigue issues. Currently holding statin due to elevated LFTs.      Dyslipidemia, goal LDL below 70 (Chronic)    We checked his labs last visit lipid panel looked great, but LFTs were elevated.  He was told to hold his statin until we have follow-up.  Pending results on follow-up we will restart atorvastatin.  Would then need to reassess LFTs in a few months.  If there are any other than abnormal yet again I would think it could be related to atorvastatin, otherwise it is more likely related to what of her systemic condition was going on when he was not feeling well.      Essential hypertension - Primary (Chronic)    Blood pressure actually looks a little bit better today.  When I last saw him we reduced lisinopril to 10 mg.  He did have a hypertensive response to exercise on Myoview.  I suspect that we will probably need to go back up on this dose eventually. He is not on a beta-blocker because of fatigue.      Obesity (BMI 30.0-34.9) (Chronic)    He actually put back on some weight since his last visit. Remains active.  I suspect he is just feeling better overall.         Current medicines are reviewed at length with the patient today. (+/- concerns) n/a The following changes have been made: n/a  Patient Instructions  Medication Instructions:  Your physician recommends that you continue on your current medications as directed. Please refer to the Current  Medication list given to you today.  Labwork: TODAY (Hepatic).  Follow-Up: Your physician wants you to  follow-up in: 6 months with Dr. Ellyn Hack.  You will receive a reminder letter in the mail two months in advance. If you don't receive a letter, please call our office to schedule the follow-up appointment.   Any Other Special Instructions Will Be Listed Below (If Applicable).     If you need a refill on your cardiac medications before your next appointment, please call your pharmacy.     Studies Ordered:   Orders Placed This Encounter  Procedures  . Hepatic function panel      Glenetta Hew, M.D., M.S. Interventional Cardiologist   Pager # [phone] Phone # [phone] [address] [place], [place] [address]

## 2017-10-10 NOTE — Assessment & Plan Note (Signed)
Thankfully, his exertional dyspnea and fatigue is improved and he is not having any symptoms concerning for angina at this point.  Myoview stress test was negative for ischemia, and he was able to go for 10 MET. Continue Plavix without aspirin.  Not on beta-blocker for fatigue issues. Currently holding statin due to elevated LFTs.

## 2017-10-10 NOTE — Patient Instructions (Signed)
Medication Instructions:  Your physician recommends that you continue on your current medications as directed. Please refer to the Current Medication list given to you today.  Labwork: TODAY (Hepatic).  Follow-Up: Your physician wants you to follow-up in: 6 months with Dr. Herbie BaltimoreHarding.  You will receive a reminder letter in the mail two months in advance. If you don't receive a letter, please call our office to schedule the follow-up appointment.   Any Other Special Instructions Will Be Listed Below (If Applicable).     If you need a refill on your cardiac medications before your next appointment, please call your pharmacy.

## 2017-10-10 NOTE — Assessment & Plan Note (Signed)
Blood pressure actually looks a little bit better today.  When I last saw him we reduced lisinopril to 10 mg.  He did have a hypertensive response to exercise on Myoview.  I suspect that we will probably need to go back up on this dose eventually. He is not on a beta-blocker because of fatigue.

## 2017-10-10 NOTE — Assessment & Plan Note (Signed)
We checked his labs last visit lipid panel looked great, but LFTs were elevated.  He was told to hold his statin until we have follow-up.  Pending results on follow-up we will restart atorvastatin.  Would then need to reassess LFTs in a few months.  If there are any other than abnormal yet again I would think it could be related to atorvastatin, otherwise it is more likely related to what of her systemic condition was going on when he was not feeling well.

## 2017-10-13 ENCOUNTER — Telehealth: Payer: Self-pay | Admitting: *Deleted

## 2017-10-13 DIAGNOSIS — R945 Abnormal results of liver function studies: Secondary | ICD-10-CM

## 2017-10-13 DIAGNOSIS — Z79899 Other long term (current) drug therapy: Secondary | ICD-10-CM

## 2017-10-13 DIAGNOSIS — R7989 Other specified abnormal findings of blood chemistry: Secondary | ICD-10-CM

## 2017-10-13 NOTE — Telephone Encounter (Signed)
-----   Message from Marykay Lexavid W Harding, MD sent at 10/10/2017  8:26 PM EST ----- Liver function tests look better.  Let us wait another 2 weeks, then restart atorvastatin.  Recheck liver function labs after 2 months.  Bryan Lemmaavid Harding, MD   pls forward to PCP: Abigail MiyamotoPerry, Lawrence Edward, MD

## 2017-10-13 NOTE — Telephone Encounter (Signed)
Spoke to patient. Result given . Verbalized understanding Will mail lab slip in 2 months to recheck lft's Routed/forward  to primary

## 2017-11-15 ENCOUNTER — Telehealth: Payer: Self-pay | Admitting: *Deleted

## 2017-11-15 DIAGNOSIS — R945 Abnormal results of liver function studies: Secondary | ICD-10-CM

## 2017-11-15 DIAGNOSIS — R7989 Other specified abnormal findings of blood chemistry: Secondary | ICD-10-CM

## 2017-11-15 DIAGNOSIS — Z79899 Other long term (current) drug therapy: Secondary | ICD-10-CM

## 2017-11-15 NOTE — Telephone Encounter (Signed)
-----   Message from Tobin ChadSharon Aaran Enberg V, RN sent at 10/13/2017 10:13 AM EST ----- MAIL IN DEC  11  DUE Dec 15 2017 -- LFTS

## 2017-11-15 NOTE — Telephone Encounter (Signed)
MAILED LETTER AND LABSLIP 

## 2017-12-28 ENCOUNTER — Telehealth: Payer: Self-pay | Admitting: Cardiology

## 2017-12-28 MED ORDER — CLOPIDOGREL BISULFATE 75 MG PO TABS: 75.0000 mg | ORAL_TABLET | Freq: Every day | ORAL | 1 refills | Status: DC

## 2017-12-28 MED ORDER — ATORVASTATIN CALCIUM 40 MG PO TABS: 40.0000 mg | ORAL_TABLET | Freq: Every day | ORAL | 1 refills | Status: DC

## 2017-12-28 NOTE — Telephone Encounter (Signed)
New message   *STAT* If patient is at the pharmacy, call can be transferred to refill team.   1. Which medications need to be refilled? (please list name of each medication and dose if known) clopidogrel (PLAVIX) 75 MG tablet  2. Which pharmacy/location (including street and city if local pharmacy) is medication to be sent to? [facility]   3. Do they need a 30 day or 90 day supply? 90

## 2018-02-16 ENCOUNTER — Other Ambulatory Visit: Payer: Self-pay | Admitting: Sports Medicine

## 2018-02-16 ENCOUNTER — Ambulatory Visit (INDEPENDENT_AMBULATORY_CARE_PROVIDER_SITE_OTHER): Admitting: Sports Medicine

## 2018-02-16 ENCOUNTER — Encounter: Payer: Self-pay | Admitting: Sports Medicine

## 2018-02-16 ENCOUNTER — Ambulatory Visit (INDEPENDENT_AMBULATORY_CARE_PROVIDER_SITE_OTHER)

## 2018-02-16 DIAGNOSIS — E119 Type 2 diabetes mellitus without complications: Secondary | ICD-10-CM

## 2018-02-16 DIAGNOSIS — M19079 Primary osteoarthritis, unspecified ankle and foot: Secondary | ICD-10-CM

## 2018-02-16 DIAGNOSIS — M21622 Bunionette of left foot: Secondary | ICD-10-CM | POA: Diagnosis not present

## 2018-02-16 DIAGNOSIS — Z7901 Long term (current) use of anticoagulants: Secondary | ICD-10-CM

## 2018-02-16 DIAGNOSIS — M21621 Bunionette of right foot: Secondary | ICD-10-CM

## 2018-02-16 DIAGNOSIS — Z8781 Personal history of (healed) traumatic fracture: Secondary | ICD-10-CM | POA: Diagnosis not present

## 2018-02-16 DIAGNOSIS — M79671 Pain in right foot: Secondary | ICD-10-CM

## 2018-02-16 DIAGNOSIS — M79672 Pain in left foot: Secondary | ICD-10-CM | POA: Diagnosis not present

## 2018-02-16 DIAGNOSIS — M21969 Unspecified acquired deformity of unspecified lower leg: Secondary | ICD-10-CM | POA: Diagnosis not present

## 2018-02-16 NOTE — Progress Notes (Signed)
Subjective: Kristopher Jackson is a 62 y.o. male patient with history of diabetes who presents to office today complaining of burning pain and achy sensation for over the last 3 years to the sides of both feet.  Patient states that he has tried changing shoes that offer some relief however denies any other treatments.  Patient is diabetic and admits to a blood sugar today of 93 and a A1c of 6.4.  Patient is also on blood thinner for heart has a history of CAD.  Patient denies nausea vomiting fever chills or any other constitutional symptoms at this time.  Review of Systems  Musculoskeletal: Positive for joint pain.  All other systems reviewed and are negative.   Patient Active Problem List   Diagnosis Date Noted  . Abnormal LFTs 10/10/2017  . 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 Dyslipidemia, goal LDL below 70 03/01/2014  . Presence of stent in LAD coronary artery - Integrity Resolute Drug Eluting Stent, mid LAD, 02/09/2012 02/09/2012    Class: Hospitalized for  . CAD (coronary artery disease), native coronary artery - 80% mid LAD - s/p PCI with DES with distal ~50%, 30-40% RCA & LCx (anomalous LCx takeoff) 02/09/2012    Class: Diagnosis of  . Unstable angina pectoris - history of 02/06/2012    Class: Hospitalized for  . Diabetes mellitus type 2 in obese (HCC) 02/06/2012    Class: Diagnosis of  . Obesity (BMI 30.0-34.9) 02/06/2012    Class: Diagnosis of  . Metabolic syndrome 02/06/2012    Class: Diagnosis of  . Essential hypertension 02/06/2012    Class: Diagnosis of   Current Outpatient Medications on File Prior to Visit  Medication Sig Dispense Refill  . atorvastatin (LIPITOR) 40 MG tablet Take 1 tablet (40 mg total) by mouth daily. 90 tablet 1  . clopidogrel (PLAVIX) 75 MG tablet Take 1 tablet (75 mg total) by mouth daily. 90 tablet 1  . JARDIANCE 25 MG TABS tablet Take 1 tablet by mouth daily.    Marland Kitchen lisinopril (PRINIVIL,ZESTRIL) 20 MG tablet Take 0.5 tablets (10 mg total) by mouth daily.  90 tablet 3  . metFORMIN (GLUCOPHAGE) 500 MG tablet Take 500 mg 2 (two) times daily with a meal by mouth.     . nitroGLYCERIN (NITROSTAT) 0.4 MG SL tablet Place 1 tablet (0.4 mg total) under the tongue every 5 (five) minutes as needed for chest pain. 25 tablet 6  . pantoprazole (PROTONIX) 40 MG tablet Take 40 mg by mouth daily.     No current facility-administered medications on file prior to visit.    Allergies  Allergen Reactions  . Parafon Forte Dsc [Chlorzoxazone]   . Chlorzoxazone Nausea And Vomiting  . Septra [Bactrim] Rash and Other (See Comments)    Found out in [employer]. Rash itch    No results found for this or any previous visit (from the past 2160 hour(s)).  Objective: General: Patient is awake, alert, and oriented x 3 and in no acute distress.  Integument: Skin is warm, dry and supple bilateral. Nails are short, thickened and  dystrophic with subungual debris, consistent with onychomycosis, 1-5 bilateral.  There is plantar scaly skin in a moccasin distribution consistent with tinea that is asymptomatic with no signs of infection. No open lesions or preulcerative lesions present bilateral. Remaining integument unremarkable.  Vasculature:  Dorsalis Pedis pulse 1/4 bilateral. Posterior Tibial pulse  1/4 bilateral.  Capillary fill time <3 sec 1-5 bilateral. Positive hair growth to the level of the digits. Temperature gradient  within normal limits. Minimal varicosities present bilateral. Trace edema present bilateral.   Neurology: The patient has intact sensation measured with a 5.07/10g Semmes Weinstein Monofilament at all pedal sites bilateral. Vibratory sensation diminished bilateral with tuning fork. No Babinski sign present bilateral.   Musculoskeletal: Symptomatic tailor's bunion bilateral.  Asymptomatic pes planus and arthritis of foot. Muscular strength 5/5 in all lower extremity muscular groups bilateral without pain on range of motion however there is limitation due to  arthritis. No tenderness with calf compression bilateral.  X-rays bilateral: Normal osseous mineralization there is severe joint space narrowing at the first metatarsophalangeal joint midtarsal joint ankle joint and at the subtalar joint on the left with a history of calcaneal fracture and depression of the body of the calcaneus with severe arthrosis present, there is digital deformity supportive of hammertoe and midtarsal breech supportive of pes planus deformity.  There is mild enlargement of the fifth metatarsal head supportive of tailor's bunion right greater than left, there is mild enthesis at fifth metatarsal base at peroneal insertion.  No other acute findings.  Assessment and Plan: Problem List Items Addressed This Visit    None    Visit Diagnoses    Foot pain, bilateral    -  Primary   Relevant Orders   DG Foot Complete Right   DG Foot Complete Left   Tailor's bunion of both feet       Diabetes mellitus without complication (HCC)       Acquired deformity of foot, unspecified laterality       Arthritis of foot       History of fracture       Current use of long term anticoagulation          -Examined patient. -Discussed and educated patient on diabetic foot care, especially with  regards to the vascular, neurological and musculoskeletal systems -Stressed the importance of good glycemic control and the detriment of not  controlling glucose levels in relation to the foot -Dispensed [facility] tailor's bunion padding to wear when in shoes and encouraged patient to choose shoes that offer support and prevent irritation to the bunion sites since patient refused more aggressive treatment or injection this visit -Prescribed custom foot orthotics and diabetic shoes, [facility] -Recommend over-the-counter topical pain creams to help as needed -Ros patient if tinea on the bottom of the feet becomes itchy or if he notices more skin scaling and peeling it is wise to start with  over-the-counter Lamisil cream, foot powder, and/or spray.  May also use pumice stone for exfoliation and good skin creams and moisturizers for supportive care -Answered all patient questions -Patient to return as needed -Patient advised to call the office if any problems or questions arise in the meantime.  Asencion Islamitorya Rahmon Heigl, DPM

## 2018-02-16 NOTE — Patient Instructions (Signed)

## 2018-07-09 ENCOUNTER — Other Ambulatory Visit: Payer: Self-pay | Admitting: Cardiology

## 2018-10-05 ENCOUNTER — Other Ambulatory Visit: Payer: Self-pay | Admitting: Internal Medicine

## 2018-11-29 DIAGNOSIS — J069 Acute upper respiratory infection, unspecified: Secondary | ICD-10-CM | POA: Insufficient documentation

## 2019-04-19 IMAGING — NM NM MISC PROCEDURE
9 series · 54 of 54 positions shown · non-contrast
Comparison: none

[Series 1: rest sax · 6.4mm · 6.40mm/px · 6 of 20 frames shown]
[frame 2/20]
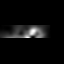
[frame 5/20]
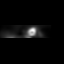
[frame 9/20]
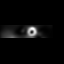
[frame 12/20]
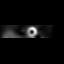
[frame 15/20]
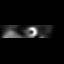
[frame 19/20]
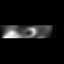

[Series 1: wbr_r-proj_st wbr rest · 6.40mm/px · 6 of 64 frames shown]
[frame 6/64]
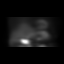
[frame 16/64]
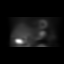
[frame 27/64]
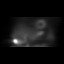
[frame 38/64]
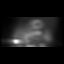
[frame 48/64]
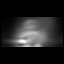
[frame 59/64]
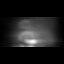

[Series 1: wbr rest · 6.40mm/px · 6 of 64 frames shown]
[frame 6/64]
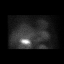
[frame 16/64]
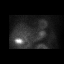
[frame 27/64]
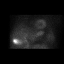
[frame 38/64]
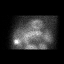
[frame 48/64]
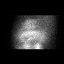
[frame 59/64]
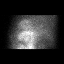

[Series 2: wbr_s-proj_st wbr stress-gsp · 6.40mm/px · 6 of 512 frames shown]
[frame 43/512]
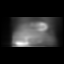
[frame 128/512]
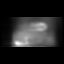
[frame 214/512]
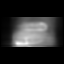
[frame 299/512]
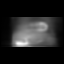
[frame 384/512]
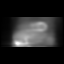
[frame 470/512]
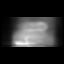

[Series 2: wbr stress-gsp · 6.40mm/px · 6 of 510 frames shown]
[frame 43/510  full-range]
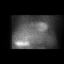
[frame 128/510  full-range]
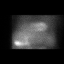
[frame 213/510  full-range]
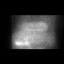
[frame 298/510  full-range]
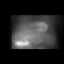
[frame 383/510  full-range]
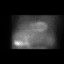
[frame 468/510  full-range]
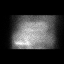

[Series 2: stress sax · 6.4mm · 6.40mm/px · 6 of 22 frames shown]
[frame 2/22]
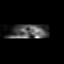
[frame 6/22]
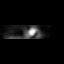
[frame 10/22]
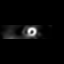
[frame 13/22]
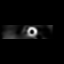
[frame 17/22]
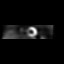
[frame 21/22]
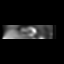

[Series 2: stress sax gs · 6.4mm · 6.40mm/px · 6 of 176 frames shown]
[frame 15/176]
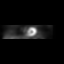
[frame 44/176]
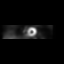
[frame 74/176]
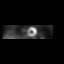
[frame 103/176]
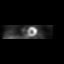
[frame 132/176]
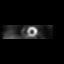
[frame 162/176]
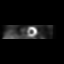

[Series 3: wbr_s-proj_st wbr stress-sum-em · 6.40mm/px · 6 of 64 frames shown]
[frame 6/64]
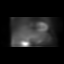
[frame 16/64]
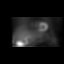
[frame 27/64]
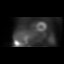
[frame 38/64]
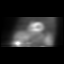
[frame 48/64]
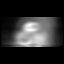
[frame 59/64]
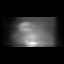

[Series 3: wbr stress-sum-em · 6.40mm/px · 6 of 64 frames shown]
[frame 6/64]
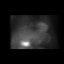
[frame 16/64]
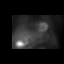
[frame 27/64]
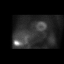
[frame 38/64]
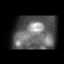
[frame 48/64]
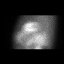
[frame 59/64]
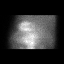

[54 of 54 positions shown; findings below may reference images not displayed]

Canned report from images found in remote index.

Refer to host system for actual result text.

## 2019-11-18 ENCOUNTER — Other Ambulatory Visit: Payer: Self-pay

## 2019-11-18 MED ORDER — CLOPIDOGREL BISULFATE 75 MG PO TABS: 75.0000 mg | ORAL_TABLET | Freq: Every day | ORAL | 0 refills | Status: DC

## 2019-12-16 ENCOUNTER — Other Ambulatory Visit: Payer: Self-pay

## 2019-12-16 MED ORDER — CLOPIDOGREL BISULFATE 75 MG PO TABS: 75.0000 mg | ORAL_TABLET | Freq: Every day | ORAL | 0 refills | Status: DC

## 2019-12-16 NOTE — Telephone Encounter (Signed)
Pt walked in to [facility] office and informed front desk office that he had a RX for Plavix that should have been sent to his pharmacy. Triage nurse reviewed pt chart. Pt last seen 10/10/2017 and was advised to f/u 6 months later. Pt overdue for appt. Informed front desk staff Morrie Sheldon that pt needs appt and triage nurse can send Rx for Plavix to his pharmacy. Reviewed pt appts. Pt currently set up for appt 1/20 with Dr. Herbie Baltimore. Nurse sent Rx for Plavix 75 mg to [place] pharmacy but pt last Rx was sent to [id number] - [place], [place] - [address] AT [facility] [phone] Contacted pt to confirm. Pt preferred pharmacy is [id number] - [place], [place] - [address] AT [facility] [phone]. Pt states an Rx for his Plavix was sent on 11/18/2019 to [id number] - [place], [place] - [address] AT [facility] [phone] but he was told by the pharmacy multiple times that they never received the Rx. He now states they have found the Rx from that date and he is going to the pharmacy to pick up so he does not need another Rx. Pt states he has enough tablets to last him until his upcoming appt on 1/20

## 2019-12-25 ENCOUNTER — Other Ambulatory Visit: Payer: Self-pay

## 2019-12-25 ENCOUNTER — Encounter: Payer: Self-pay | Admitting: Cardiology

## 2019-12-25 ENCOUNTER — Ambulatory Visit (INDEPENDENT_AMBULATORY_CARE_PROVIDER_SITE_OTHER): Admitting: Cardiology

## 2019-12-25 VITALS — BP 117/75 | HR 55 | Temp 97.3°F | Ht 68.0 in | Wt 196.8 lb

## 2019-12-25 DIAGNOSIS — E785 Hyperlipidemia, unspecified: Secondary | ICD-10-CM

## 2019-12-25 DIAGNOSIS — I10 Essential (primary) hypertension: Secondary | ICD-10-CM | POA: Diagnosis not present

## 2019-12-25 DIAGNOSIS — I25110 Atherosclerotic heart disease of native coronary artery with unstable angina pectoris: Secondary | ICD-10-CM | POA: Diagnosis not present

## 2019-12-25 DIAGNOSIS — E669 Obesity, unspecified: Secondary | ICD-10-CM | POA: Diagnosis not present

## 2019-12-25 NOTE — Progress Notes (Signed)
Primary Care Provider: Abigail Miyamoto, MD Cardiologist: No primary care provider on file. Electrophysiologist:   Clinic Note: Chief Complaint  Patient presents with  . Follow-up    No symptoms  . Coronary Artery Disease    No angina    HPI:    Kristopher Jackson is a 64 y.o. male with a PMH below who presents today for annual follow-up.  Aarik Blank was last seen in Nov 2018 -this is a follow-up from a Myoview stress test that was read as nonischemic.  No further symptoms after that.  Recent Hospitalizations: None  Reviewed  CV studies:    The following studies were reviewed today: (if available, images/films reviewed: From Epic Chart or Care Everywhere) . None:   Interval History:   Kristopher Jackson returns here today for routine follow-up feeling quite well.  No major complaints.  He does acknowledge that had been a little bit less active than usual and put on little bit of weight, but in the last few months he is actually started losing it back. 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  No resting exertional dyspnea.  No heart failure symptoms of PND, orthopnea or edema..   CV Review of Symptoms (Summary): no chest pain or dyspnea on exertion positive for - palpitations negative for - irregular heartbeat, orthopnea, paroxysmal nocturnal dyspnea, rapid heart rate, shortness of breath or Syncope/near syncope, TIA/amaurosis fugax, claudication  The patient does not have symptoms concerning for COVID-19 infection (fever, chills, cough, or new shortness of breath).  The patient is practicing social distancing and masking.   REVIEWED OF SYSTEMS   A comprehensive ROS was performed. Review of Systems  Constitutional: Negative for malaise/fatigue and weight loss.  HENT: Negative for congestion and nosebleeds.   Respiratory: Negative for shortness of breath and wheezing.   Cardiovascular:  Negative.        Per HPI  Gastrointestinal: Negative for blood in stool, heartburn, melena and nausea.  Genitourinary: Negative for hematuria.  Musculoskeletal: Positive for joint pain (Normal arthritis pains). Negative for falls.  Neurological: Negative for dizziness, focal weakness, weakness and headaches.  Endo/Heme/Allergies: Positive for environmental allergies.  Psychiatric/Behavioral: Negative for memory loss. The patient is not nervous/anxious and does not have insomnia.   All other systems reviewed and are negative.   I have reviewed and (if needed) personally updated the patient's problem list, medications, allergies, past medical and surgical history, social and family history.   PAST MEDICAL HISTORY   Past Medical History:  Diagnosis Date  . Coronary artery disease 02/09/2012   99% early Mid LAD (@Diag ); not on blocker due to bradycardia  . Diabetes mellitus   . GERD (gastroesophageal reflux disease)   . H/O class III angina pectoris 02/2012   - Referred for cardiac cath  . H/O: pneumonia   . Hypertension   . Presence of stent in LAD coronary artery - 02/09/2012    Integrity Resolute Drug Eluting Stent (3.0 mm x 26 mm -> 3.3 mm), mid LAD, 02/09/2012           PAST SURGICAL HISTORY   Past Surgical History:  Procedure Laterality Date  . CORONARY ANGIOPLASTY WITH STENT PLACEMENT  02/09/2012    80% LAD, (AT branch-point with major diagonal (D2) AND SEPTAL PERFORATOR (SP2) with a IntegrityResolute DES 3.0 mm x 26 mm; final diameter 3.3 mm proximal  & mid ; 3.1 mm distal   .  DOPPLER ECHOCARDIOGRAPHY  02/07/2012   EF=>55% BODERLINE LVH,NORMAL DIASTOLIC FUNCTION  . LEFT HEART CATHETERIZATION WITH CORONARY ANGIOGRAM N/A 02/09/2012   Procedure: LEFT HEART CATHETERIZATION WITH CORONARY ANGIOGRAM;  Surgeon: Marykay Lex, MD;  Location: [facility] CATH LAB;  Service: Cardiovascular;  Laterality: N/A;  . NM MYOVIEW LTD  09/2017   Exercise 8:35 min, peak HR 141 bpm (88% MPHR).  Hypertensive  response with blood pressure going from 107/78 up to 158/117 mmHg.  10.4 METs.   Stopped due to fatigue and weakness.  Also noted shortness of breath.  No ischemia or infarct noted.   EF ~60%.  . TONSILLECTOMY      MEDICATIONS/ALLERGIES  .   Current Meds  Medication Sig  . atorvastatin (LIPITOR) 40 MG tablet Take 1 tablet (40 mg total) by mouth daily.  . clopidogrel (PLAVIX) 75 MG tablet Take 1 tablet (75 mg total) by mouth daily.  Marland Kitchen JARDIANCE 25 MG TABS tablet Take 1 tablet by mouth daily.  Marland Kitchen lisinopril (PRINIVIL,ZESTRIL) 20 MG tablet Take 0.5 tablets (10 mg total) by mouth daily.  . metFORMIN (GLUCOPHAGE) 500 MG tablet Take 500 mg 2 (two) times daily with a meal by mouth.   . nitroGLYCERIN (NITROSTAT) 0.4 MG SL tablet Place 1 tablet (0.4 mg total) under the tongue every 5 (five) minutes as needed for chest pain.  . pantoprazole (PROTONIX) 40 MG tablet Take 40 mg by mouth daily.    Allergies  Allergen Reactions  . Parafon Forte Dsc [Chlorzoxazone]   . Chlorzoxazone Nausea And Vomiting  . Septra [Bactrim] Rash and Other (See Comments)    Found out in [employer]. Rash itch     SOCIAL HISTORY/FAMILY HISTORY   Social History   Tobacco Use  . Smoking status: Never Smoker  . Smokeless tobacco: Never Used  Substance Use Topics  . Alcohol use: Yes    Comment: SOCIAL  . Drug use: No   Social History   Social History Narrative   Married father of one. Exercises routinely without significant symptoms. 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   He does not smoke.   He drinks up to 3 alcohol drinks a week.    Family History family history includes Heart attack (age of onset: 36) in his father.   OBJCTIVE -PE, EKG, labs   Wt Readings from Last 3 Encounters:  12/25/19 196 lb 12.8 oz (89.3 kg)  10/10/17 201 lb (91.2 kg)  09/07/17 194 lb (88 kg)    Physical Exam: BP 117/75   Pulse (!) 55   Temp (!)  97.3 F (36.3 C)   Ht 5\' 8"  (1.727 m)   Wt 196 lb 12.8 oz (89.3 kg)   SpO2 98%   BMI 29.92 kg/m  Physical Exam  Constitutional: He is oriented to person, place, and time. He appears well-developed and well-nourished. No distress.  Well-groomed.  Healthy-appearing  HENT:  Head: Normocephalic and atraumatic.  Neck: No hepatojugular reflux and no JVD present. Carotid bruit is not present.  Cardiovascular: Normal rate, regular rhythm, normal heart sounds and intact distal pulses.  No extrasystoles are present. PMI is not displaced. Exam reveals no gallop and no friction rub.  No murmur heard. Pulmonary/Chest: Effort normal and breath sounds normal. No respiratory distress. He has no wheezes. He has no rales.  Abdominal: Soft. Bowel sounds are normal. He exhibits no distension. There is no abdominal tenderness. There is no rebound.  Musculoskeletal:        General: No edema. Normal range of motion.     Cervical back: Normal range of motion and neck supple.  Neurological: He is alert and oriented to person, place, and time.  Psychiatric: He has a normal mood and affect. His behavior is normal. Judgment and thought content normal.  Vitals reviewed.   Adult ECG Report  Rate: 55 ;  Rhythm: normal sinus rhythm and Left axis deviation (-45).  Inferior MI, age undetermined.;   Narrative Interpretation: Stable EKG.  Recent Labs: Labs are just checked by PCP December 16, 2019  Na+ 144, K+ 5.2, Cl- 108, HCO3-28, BUN 16, Cr 1.26, Glu 142, Ca2+ 9.6; AST 40, ALT 48, AlkP 93  TC 116, TG 86, HDL 41, LDL 65; TSH 3.119.  Lab Results  Component Value Date   CHOL 142 09/07/2017   HDL 41 09/07/2017   LDLCALC 72 09/07/2017   TRIG 147 09/07/2017   CHOLHDL 3.5 09/07/2017   Lab Results  Component Value Date   CREATININE 1.27 09/07/2017   BUN 14 09/07/2017   NA 137 09/07/2017   K 4.7 09/07/2017   CL 97 09/07/2017   CO2 22 09/07/2017    ASSESSMENT/PLAN    Problem List Items Addressed This  Visit    CAD (coronary artery disease), native coronary artery - 80% mid LAD - s/p PCI with DES with distal ~50%, 30-40% RCA & LCx (anomalous LCx takeoff) - Primary (Chronic)    He continues to do well with no active angina or heart failure symptoms.  On stable dose of statin and ACE inhibitor.  Not on beta-blocker because of history of bradycardia and fatigue.  Continues to be on maintenance dose Plavix --> Okay to hold Plavix for 5 to 7 days preop for any procedures or surgeries.       Relevant Orders   EKG 12-Lead (Completed)   Essential hypertension (Chronic)    Blood pressure current dose of lisinopril.  Feels better since we reduced to 10 mg.      Dyslipidemia, goal LDL below 70 (Chronic)    Recent labs look great on current dose of atorvastatin.      Obesity (BMI 30.0-34.9) (Chronic)    Doing well.  BMI is now less than 30.  He had gained a lot of weight, but now is lost it back.  I congratulated him on getting back to his baseline weight.  Hopefully he will continue on this trend.          COVID-19 Education: The signs and symptoms of COVID-19 were discussed with the patient and how to seek care for testing (follow up with PCP or arrange E-visit).   The importance of social distancing was discussed today.  I spent a total of 22 minutes with the patient and chart review. >  50% of the time was spent in direct patient consultation.  Additional time spent with chart review (studies, outside notes, etc): 9 Total Time:  Current medicines are reviewed at length with the patient today.  (+/- concerns) none   Patient Instructions / Medication Changes & Studies & Tests Ordered   Patient Instructions  Medication Instructions:  Your physician recommends that you continue on your current medications as directed. Please refer to the Current Medication list given to you today. *If you need a refill on your cardiac medications before your next appointment, please call your  pharmacy*  Lab Work: none If you have labs (blood work) drawn today  and your tests are completely normal, you will receive your results only by: Marland Kitchen MyChart Message (if you have MyChart) OR . A paper copy in the mail If you have any lab test that is abnormal or we need to change your treatment, we will call you to review the results.  Testing/Procedures: none  Follow-Up: At [facility], you and your health needs are our priority.  As part of our continuing mission to provide you with exceptional heart care, we have created designated Provider Care Teams.  These Care Teams include your primary Cardiologist (physician) and Advanced Practice Providers (APPs -  Physician Assistants and Nurse Practitioners) who all work together to provide you with the care you need, when you need it.  Your next appointment:   12 month(s)  The format for your next appointment:   In Person  Provider:   Glenetta Hew, MD     Studies Ordered:   Orders Placed This Encounter  Procedures  . EKG 12-Lead     Glenetta Hew, M.D., M.S. Interventional Cardiologist   Pager # [phone] Phone # [phone] [address] [place], [place] [address]   Thank you for choosing Heartcare at [facility]!!

## 2019-12-25 NOTE — Patient Instructions (Signed)
Medication Instructions:  Your physician recommends that you continue on your current medications as directed. Please refer to the Current Medication list given to you today. *If you need a refill on your cardiac medications before your next appointment, please call your pharmacy*  Lab Work: none If you have labs (blood work) drawn today and your tests are completely normal, you will receive your results only by: Marland Kitchen MyChart Message (if you have MyChart) OR . A paper copy in the mail If you have any lab test that is abnormal or we need to change your treatment, we will call you to review the results.  Testing/Procedures: none  Follow-Up: At [facility], you and your health needs are our priority.  As part of our continuing mission to provide you with exceptional heart care, we have created designated Provider Care Teams.  These Care Teams include your primary Cardiologist (physician) and Advanced Practice Providers (APPs -  Physician Assistants and Nurse Practitioners) who all work together to provide you with the care you need, when you need it.  Your next appointment:   12 month(s)  The format for your next appointment:   In Person  Provider:   Bryan Lemma, MD

## 2019-12-27 ENCOUNTER — Encounter: Payer: Self-pay | Admitting: Cardiology

## 2019-12-27 NOTE — Assessment & Plan Note (Signed)
Blood pressure current dose of lisinopril.  Feels better since we reduced to 10 mg.

## 2019-12-27 NOTE — Assessment & Plan Note (Signed)
He continues to do well with no active angina or heart failure symptoms.  On stable dose of statin and ACE inhibitor.  Not on beta-blocker because of history of bradycardia and fatigue.  Continues to be on maintenance dose Plavix --> Okay to hold Plavix for 5 to 7 days preop for any procedures or surgeries.

## 2019-12-27 NOTE — Assessment & Plan Note (Signed)
Recent labs look great on current dose of atorvastatin.

## 2019-12-27 NOTE — Assessment & Plan Note (Signed)
Doing well.  BMI is now less than 30.  He had gained a lot of weight, but now is lost it back.  I congratulated him on getting back to his baseline weight.  Hopefully he will continue on this trend.

## 2020-01-13 DIAGNOSIS — E1142 Type 2 diabetes mellitus with diabetic polyneuropathy: Secondary | ICD-10-CM | POA: Insufficient documentation

## 2020-01-22 ENCOUNTER — Other Ambulatory Visit: Payer: Self-pay | Admitting: Cardiology

## 2020-08-11 DIAGNOSIS — L02612 Cutaneous abscess of left foot: Secondary | ICD-10-CM | POA: Insufficient documentation

## 2020-08-11 DIAGNOSIS — L97521 Non-pressure chronic ulcer of other part of left foot limited to breakdown of skin: Secondary | ICD-10-CM | POA: Insufficient documentation

## 2020-08-18 DIAGNOSIS — L03032 Cellulitis of left toe: Secondary | ICD-10-CM | POA: Insufficient documentation

## 2020-09-29 ENCOUNTER — Encounter: Payer: Self-pay | Admitting: Legal Medicine

## 2020-09-29 ENCOUNTER — Ambulatory Visit (INDEPENDENT_AMBULATORY_CARE_PROVIDER_SITE_OTHER): Admitting: Legal Medicine

## 2020-09-29 ENCOUNTER — Other Ambulatory Visit: Payer: Self-pay

## 2020-09-29 VITALS — BP 140/80 | HR 84 | Temp 97.9°F | Resp 16 | Ht 67.0 in | Wt 197.0 lb

## 2020-09-29 DIAGNOSIS — L03012 Cellulitis of left finger: Secondary | ICD-10-CM

## 2020-09-29 DIAGNOSIS — M75121 Complete rotator cuff tear or rupture of right shoulder, not specified as traumatic: Secondary | ICD-10-CM | POA: Diagnosis not present

## 2020-09-29 DIAGNOSIS — Z23 Encounter for immunization: Secondary | ICD-10-CM | POA: Diagnosis not present

## 2020-09-29 DIAGNOSIS — M75100 Unspecified rotator cuff tear or rupture of unspecified shoulder, not specified as traumatic: Secondary | ICD-10-CM | POA: Insufficient documentation

## 2020-09-29 MED ORDER — CEPHALEXIN 500 MG PO CAPS: 500.0000 mg | ORAL_CAPSULE | Freq: Four times a day (QID) | ORAL | 0 refills | Status: DC

## 2020-09-29 MED ORDER — HYDROCODONE-ACETAMINOPHEN 10-325 MG PO TABS: 1.0000 | ORAL_TABLET | Freq: Three times a day (TID) | ORAL | 0 refills | Status: AC | PRN

## 2020-09-29 NOTE — Progress Notes (Signed)
Acute Office Visit  Subjective:    Patient ID: Kristopher HollerStuart Santalucia, male    DOB: 03-Jul-1956, 64 y.o.   MRN: [id number]  Chief Complaint  Patient presents with   Wound Infection    left hand on 2nd finger   Shoulder Pain    right shoulder pain    HPI Patient is in today for infected  Laceration cut left 10.index finger on12/2021.  It was healing but now draining pus and swelling and tender.    He sees Dr. Hal Neerillis for feet, and endocrinologist for diabetes  And Dr. Lolita RiegerHaring for cardiology.  We have nor seen in one year.  Patient has a torn rotator cuff seeing Dr. Elita Quickowen for torn rotator cuff, then would not give him any pain medicines.  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   Coronary artery disease 02/09/2012   99% early Mid LAD (@Diag ); not on blocker due to bradycardia   Diabetes mellitus    GERD (gastroesophageal reflux disease)    H/O class III angina pectoris 02/2012   - Referred for cardiac cath   H/O: pneumonia    Hypertension    Presence of stent in LAD coronary artery - 02/09/2012    Integrity Resolute Drug Eluting Stent (3.0 mm x 26 mm -> 3.3 mm), mid LAD, 02/09/2012         Unstable angina pectoris - history of 02/06/2012    Past Surgical History:  Procedure Laterality Date   CORONARY ANGIOPLASTY WITH STENT PLACEMENT  02/09/2012    80% LAD, (AT branch-point with major diagonal (D2) AND SEPTAL PERFORATOR (SP2) with a IntegrityResolute DES 3.0 mm x 26 mm; final diameter 3.3 mm proximal  & mid ; 3.1 mm distal    DOPPLER ECHOCARDIOGRAPHY  02/07/2012   EF=>55% BODERLINE LVH,NORMAL DIASTOLIC FUNCTION   LEFT HEART CATHETERIZATION WITH CORONARY ANGIOGRAM N/A 02/09/2012   Procedure: LEFT HEART CATHETERIZATION WITH CORONARY ANGIOGRAM;  Surgeon: Marykay Lexavid W Harding, MD;  [facility];  Service: Cardiovascular;  Laterality: N/A;   NM MYOVIEW LTD  09/2017   Exercise 8:35 min, peak HR 141 bpm (88% MPHR).  Hypertensive response with blood  pressure going from 107/78 up to 158/117 mmHg.  10.4 METs.   Stopped due to fatigue and weakness.  Also noted shortness of breath.  No ischemia or infarct noted.   EF ~60%.   TONSILLECTOMY      Family History  Problem Relation Age of Onset   Heart attack Father 2675    Social History   Socioeconomic History   Marital status: Married    Spouse name: Not on file   Number of children: Not on file   Years of education: Not on file   Highest education level: Not on file  Occupational History   Not on file  Tobacco Use   Smoking status: Never Smoker   Smokeless tobacco: Never Used  Substance and Sexual Activity   Alcohol use: Yes    Comment: SOCIAL   Drug use: No   Sexual activity: Yes  Other Topics Concern   Not on file  Social History Narrative   Married father of one. Exercises routinely without significant symptoms. 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   He does not smoke.   He drinks up to 3 alcohol drinks a week.   Social Determinants of Health   Financial Resource Strain:  Difficulty of Paying Living Expenses: Not on file  Food Insecurity:    Worried About Running Out of Food in the Last Year: Not on file   Ran Out of Food in the Last Year: Not on file  Transportation Needs:    Lack of Transportation (Medical): Not on file   Lack of Transportation (Non-Medical): Not on file  Physical Activity:    Days of Exercise per Week: Not on file   Minutes of Exercise per Session: Not on file  Stress:    Feeling of Stress : Not on file  Social Connections:    Frequency of Communication with Friends and Family: Not on file   Frequency of Social Gatherings with Friends and Family: Not on file   Attends Religious Services: Not on file   Active Member of Clubs or Organizations: Not on file   Attends Banker Meetings: Not on file   Marital Status: Not on file    Intimate Partner Violence:    Fear of Current or Ex-Partner: Not on file   Emotionally Abused: Not on file   Physically Abused: Not on file   Sexually Abused: Not on file    Outpatient Medications Prior to Visit  Medication Sig Dispense Refill   atorvastatin (LIPITOR) 40 MG tablet Take 1 tablet (40 mg total) by mouth daily. 90 tablet 1   clopidogrel (PLAVIX) 75 MG tablet TAKE 1 TABLET DAILY 90 tablet 3   gabapentin (NEURONTIN) 100 MG capsule Take up to 3 capsules qHS as needed     JARDIANCE 25 MG TABS tablet Take 1 tablet by mouth daily.     lisinopril (PRINIVIL,ZESTRIL) 20 MG tablet Take 0.5 tablets (10 mg total) by mouth daily. 90 tablet 3   metFORMIN (GLUCOPHAGE) 500 MG tablet Take 500 mg 2 (two) times daily with a meal by mouth.      nitroGLYCERIN (NITROSTAT) 0.4 MG SL tablet Place 1 tablet (0.4 mg total) under the tongue every 5 (five) minutes as needed for chest pain. 25 tablet 6   pantoprazole (PROTONIX) 40 MG tablet Take 40 mg by mouth daily.     No facility-administered medications prior to visit.    Allergies  Allergen Reactions   Parafon Forte Dsc [Chlorzoxazone]    Chlorzoxazone Nausea And Vomiting   Septra [Bactrim] Rash and Other (See Comments)    Found out in [employer]. Rash itch   Sulfamethoxazole-Trimethoprim Rash    Found out in [employer].    Review of Systems  Constitutional: Negative.   HENT: Negative.   Eyes: Negative.   Respiratory: Negative for cough and shortness of breath.   Cardiovascular: Negative for chest pain, palpitations and leg swelling.  Gastrointestinal: Negative.   Endocrine: Negative.   Genitourinary: Negative.   Musculoskeletal: Negative for arthralgias (shoulder pain).  Neurological: Negative.   Psychiatric/Behavioral: Negative.        Objective:    Physical Exam Vitals reviewed.  Constitutional:      Appearance: Normal appearance.  HENT:     Right Ear: Tympanic membrane and ear canal normal.     Left Ear:  Tympanic membrane, ear canal and external ear normal.  Cardiovascular:     Rate and Rhythm: Normal rate and regular rhythm.     Pulses: Normal pulses.     Heart sounds: Normal heart sounds.  Pulmonary:     Effort: Pulmonary effort is normal.     Breath sounds: Normal breath sounds.  Musculoskeletal:     Cervical back: Normal range of  motion and neck supple.     Comments: Pain right shoulder , abduction 40 degrees, flexion 60 degrees, limited rotation.  He is seeing orthpedic  Neurological:     General: No focal deficit present.     Mental Status: He is alert and oriented to person, place, and time.     BP 140/80    Pulse 84    Temp 97.9 F (36.6 C)    Resp 16    Ht 5\' 7"  (1.702 m)    Wt 197 lb (89.4 kg)    BMI 30.85 kg/m  Wt Readings from Last 3 Encounters:  09/29/20 197 lb (89.4 kg)  12/25/19 196 lb 12.8 oz (89.3 kg)  10/10/17 201 lb (91.2 kg)    Health Maintenance Due  Topic Date Due   HEMOGLOBIN A1C  Never done   Hepatitis C Screening  Never done   FOOT EXAM  Never done   OPHTHALMOLOGY EXAM  Never done   HIV Screening  Never done   TETANUS/TDAP  Never done   COLONOSCOPY  Never done    There are no preventive care reminders to display for this patient.   No results found for: TSH Lab Results  Component Value Date   WBC 9.6 06/30/2015   HGB 13.8 06/30/2015   HCT 40.9 06/30/2015   MCV 86.7 06/30/2015   PLT 194 06/30/2015   Lab Results  Component Value Date   NA 137 09/07/2017   K 4.7 09/07/2017   CO2 22 09/07/2017   GLUCOSE 326 (H) 09/07/2017   BUN 14 09/07/2017   CREATININE 1.27 09/07/2017   BILITOT 0.6 10/10/2017   ALKPHOS 97 10/10/2017   AST 28 10/10/2017   ALT 45 (H) 10/10/2017   PROT 5.8 (L) 10/10/2017   ALBUMIN 4.3 10/10/2017   CALCIUM 9.5 09/07/2017   Lab Results  Component Value Date   CHOL 142 09/07/2017   Lab Results  Component Value Date   HDL 41 09/07/2017   Lab Results  Component Value Date   LDLCALC 72 09/07/2017   Lab  Results  Component Value Date   TRIG 147 09/07/2017   Lab Results  Component Value Date   CHOLHDL 3.5 09/07/2017   No results found for: HGBA1C     Assessment & Plan:  1. Cellulitis of finger of left hand - cephALEXin (KEFLEX) 500 MG capsule; Take 1 capsule (500 mg total) by mouth 4 (four) times daily.  Dispense: 28 capsule; Refill: 0 - Anaerobic and Aerobic Culture Patient has infection of left index finger  Wound.  start keflex  2. Nontraumatic complete tear of right rotator cuff - HYDROcodone-acetaminophen (NORCO) 10-325 MG tablet; Take 1 tablet by mouth every 8 (eight) hours as needed for up to 10 days.  Dispense: 30 tablet; Refill: 0 I gave pain medicine unil surgery  3. Encounter for immunization - Flu Vaccine MDCK QUAD PF      Meds ordered this encounter  Medications   cephALEXin (KEFLEX) 500 MG capsule    Sig: Take 1 capsule (500 mg total) by mouth 4 (four) times daily.    Dispense:  28 capsule    Refill:  0   HYDROcodone-acetaminophen (NORCO) 10-325 MG tablet    Sig: Take 1 tablet by mouth every 8 (eight) hours as needed for up to 10 days.    Dispense:  30 tablet    Refill:  0    Orders Placed This Encounter  Procedures   Anaerobic and Aerobic Culture   Flu  Vaccine MDCK QUAD PF     Follow-up: Return if symptoms worsen or fail to improve.  An After Visit Summary was printed and given to the patient.  Brent Bulla [facility] [phone]

## 2020-09-29 NOTE — Patient Instructions (Signed)
Wound Care, Adult Taking care of your wound properly can help to prevent pain, infection, and scarring. It can also help your wound to heal more quickly. How to care for your wound Wound care      Follow instructions from your health care provider about how to take care of your wound. Make sure you: ? Wash your hands with soap and water before you change the bandage (dressing). If soap and water are not available, use hand sanitizer. ? Change your dressing as told by your health care provider. ? Leave stitches (sutures), skin glue, or adhesive strips in place. These skin closures may need to stay in place for 2 weeks or longer. If adhesive strip edges start to loosen and curl up, you may trim the loose edges. Do not remove adhesive strips completely unless your health care provider tells you to do that.  Check your wound area every day for signs of infection. Check for: ? Redness, swelling, or pain. ? Fluid or blood. ? Warmth. ? Pus or a bad smell.  Ask your health care provider if you should clean the wound with mild soap and water. Doing this may include: ? Using a clean towel to pat the wound dry after cleaning it. Do not rub or scrub the wound. ? Applying a cream or ointment. Do this only as told by your health care provider. ? Covering the incision with a clean dressing.  Ask your health care provider when you can leave the wound uncovered.  Keep the dressing dry until your health care provider says it can be removed. Do not take baths, swim, use a hot tub, or do anything that would put the wound underwater until your health care provider approves. Ask your health care provider if you can take showers. You may only be allowed to take sponge baths. Medicines   If you were prescribed an antibiotic medicine, cream, or ointment, take or use the antibiotic as told by your health care provider. Do not stop taking or using the antibiotic even if your condition improves.  Take  over-the-counter and prescription medicines only as told by your health care provider. If you were prescribed pain medicine, take it 30 or more minutes before you do any wound care or as told by your health care provider. General instructions  Return to your normal activities as told by your health care provider. Ask your health care provider what activities are safe.  Do not scratch or pick at the wound.  Do not use any products that contain nicotine or tobacco, such as cigarettes and e-cigarettes. These may delay wound healing. If you need help quitting, ask your health care provider.  Keep all follow-up visits as told by your health care provider. This is important.  Eat a diet that includes protein, vitamin A, vitamin C, and other nutrient-rich foods to help the wound heal. ? Foods rich in protein include meat, dairy, beans, nuts, and other sources. ? Foods rich in vitamin A include carrots and dark green, leafy vegetables. ? Foods rich in vitamin C include citrus, tomatoes, and other fruits and vegetables. ? Nutrient-rich foods have protein, carbohydrates, fat, vitamins, or minerals. Eat a variety of healthy foods including vegetables, fruits, and whole grains. Contact a health care provider if:  You received a tetanus shot and you have swelling, severe pain, redness, or bleeding at the injection site.  Your pain is not controlled with medicine.  You have redness, swelling, or pain around the wound.    You have fluid or blood coming from the wound.  Your wound feels warm to the touch.  You have pus or a bad smell coming from the wound.  You have a fever or chills.  You are nauseous or you vomit.  You are dizzy. Get help right away if:  You have a red streak going away from your wound.  The edges of the wound open up and separate.  Your wound is bleeding, and the bleeding does not stop with gentle pressure.  You have a rash.  You faint.  You have trouble  breathing. Summary  Always wash your hands with soap and water before changing your bandage (dressing).  To help with healing, eat foods that are rich in protein, vitamin A, vitamin C, and other nutrients.  Check your wound every day for signs of infection. Contact your health care provider if you suspect that your wound is infected. This information is not intended to replace advice given to you by your health care provider. Make sure you discuss any questions you have with your health care provider. Document Revised: 03/11/2019 Document Reviewed: 06/07/2016 Elsevier Patient Education  2020 Elsevier Inc.  

## 2020-10-06 ENCOUNTER — Other Ambulatory Visit: Payer: Self-pay | Admitting: Legal Medicine

## 2020-10-06 LAB — ANAEROBIC AND AEROBIC CULTURE

## 2020-10-06 NOTE — Progress Notes (Signed)
Wound staph aureus keflex should work lp

## 2020-12-01 DIAGNOSIS — M2042 Other hammer toe(s) (acquired), left foot: Secondary | ICD-10-CM | POA: Insufficient documentation

## 2020-12-01 DIAGNOSIS — Z8631 Personal history of diabetic foot ulcer: Secondary | ICD-10-CM | POA: Insufficient documentation

## 2020-12-04 ENCOUNTER — Encounter: Payer: Self-pay | Admitting: Legal Medicine

## 2020-12-22 ENCOUNTER — Telehealth (INDEPENDENT_AMBULATORY_CARE_PROVIDER_SITE_OTHER): Admitting: Legal Medicine

## 2020-12-22 ENCOUNTER — Encounter: Payer: Self-pay | Admitting: Legal Medicine

## 2020-12-22 ENCOUNTER — Telehealth: Payer: Self-pay

## 2020-12-22 ENCOUNTER — Other Ambulatory Visit: Payer: Self-pay | Admitting: Legal Medicine

## 2020-12-22 VITALS — BP 130/80 | Ht 67.0 in | Wt 195.0 lb

## 2020-12-22 DIAGNOSIS — Z9189 Other specified personal risk factors, not elsewhere classified: Secondary | ICD-10-CM

## 2020-12-22 DIAGNOSIS — U071 COVID-19: Secondary | ICD-10-CM | POA: Diagnosis not present

## 2020-12-22 DIAGNOSIS — J018 Other acute sinusitis: Secondary | ICD-10-CM | POA: Diagnosis not present

## 2020-12-22 LAB — POC COVID19 BINAXNOW: SARS Coronavirus 2 Ag: POSITIVE — AB

## 2020-12-22 MED ORDER — CHERATUSSIN AC 100-10 MG/5ML PO SOLN: 5.0000 mL | Freq: Three times a day (TID) | ORAL | 0 refills | Status: DC | PRN

## 2020-12-22 MED ORDER — AZITHROMYCIN 250 MG PO TABS: ORAL_TABLET | ORAL | 0 refills | Status: DC

## 2020-12-22 NOTE — Telephone Encounter (Signed)
Dr. Marina Goodell please advise.  Pt was covid positive and was made aware but he is asking if something can be sent in for his cough.

## 2020-12-22 NOTE — Progress Notes (Signed)
Virtual Visit via Telephone Note   This visit type was conducted due to national recommendations for restrictions regarding the COVID-19 Pandemic (e.g. social distancing) in an effort to limit this patient's exposure and mitigate transmission in our community.  Due to his co-morbid illnesses, this patient is at least at moderate risk for complications without adequate follow up.  This format is felt to be most appropriate for this patient at this time.  The patient did not have access to video technology/had technical difficulties with video requiring transitioning to audio format only (telephone).  All issues noted in this document were discussed and addressed.  No physical exam could be performed with this format.  Patient verbally consented to a telehealth visit.   Date:  12/22/2020   ID:  Kristopher Jackson, DOB [birth year], MRN [id number]  Patient Location: Home Provider Location: Office/Clinic  PCP:  Abigail Miyamoto, MD   Evaluation Performed:  New Patient Evaluation  Chief Complaint:  Cough and congestions for 4 days, low grade fever  History of Present Illness:    Kristopher Jackson is a 65 y.o. male with Cough and congestions for 4 days, low grade fever, nonproductive cough, sore throat and sinus congestion.   The patient does have symptoms concerning for COVID-19 infection (fever, chills, cough, or new shortness of breath).    Past Medical History:  Diagnosis Date  . 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 Coronary artery disease 02/09/2012   99% early Mid LAD (@Diag ); not on blocker due to bradycardia  . Diabetes mellitus   . GERD (gastroesophageal reflux disease)   . H/O class III angina pectoris 02/2012   - Referred for cardiac cath  . H/O: pneumonia   . Hypertension   . Presence of stent in LAD coronary artery - 02/09/2012    Integrity Resolute Drug Eluting Stent (3.0 mm x 26 mm -> 3.3 mm), mid LAD, 02/09/2012        . Unstable angina pectoris - history of 02/06/2012    Past  Surgical History:  Procedure Laterality Date  . CORONARY ANGIOPLASTY WITH STENT PLACEMENT  02/09/2012    80% LAD, (AT branch-point with major diagonal (D2) AND SEPTAL PERFORATOR (SP2) with a IntegrityResolute DES 3.0 mm x 26 mm; final diameter 3.3 mm proximal  & mid ; 3.1 mm distal   . DOPPLER ECHOCARDIOGRAPHY  02/07/2012   EF=>55% BODERLINE LVH,NORMAL DIASTOLIC FUNCTION  . LEFT HEART CATHETERIZATION WITH CORONARY ANGIOGRAM N/A 02/09/2012   Procedure: LEFT HEART CATHETERIZATION WITH CORONARY ANGIOGRAM;  Surgeon: 04/10/2012, MD;  Location: [facility] CATH LAB;  Service: Cardiovascular;  Laterality: N/A;  . NM MYOVIEW LTD  09/2017   Exercise 8:35 min, peak HR 141 bpm (88% MPHR).  Hypertensive response with blood pressure going from 107/78 up to 158/117 mmHg.  10.4 METs.   Stopped due to fatigue and weakness.  Also noted shortness of breath.  No ischemia or infarct noted.   EF ~60%.  . TONSILLECTOMY      Family History  Problem Relation Age of Onset  . Heart attack Father 58    Social History   Socioeconomic History  . Marital status: Married    Spouse name: Not on file  . Number of children: Not on file  . Years of education: Not on file  . Highest education level: Not on file  Occupational History  . Not on file  Tobacco Use  . Smoking status: Never Smoker  . Smokeless tobacco: Never Used  Substance and Sexual  Activity  . Alcohol use: Yes    Comment: SOCIAL  . Drug use: No  . Sexual activity: Yes  Other Topics Concern  . Not on file  Social History Narrative   Married father of one. Exercises routinely without significant symptoms. 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   He does not smoke.   He drinks up to 3 alcohol drinks a week.   Social Determinants of Health   Financial Resource Strain: Not on file  Food Insecurity: Not on file  Transportation Needs: Not on file  Physical Activity: Not on file   Stress: Not on file  Social Connections: Not on file  Intimate Partner Violence: Not on file    Outpatient Medications Prior to Visit  Medication Sig Dispense Refill  . atorvastatin (LIPITOR) 40 MG tablet Take 1 tablet (40 mg total) by mouth daily. 90 tablet 1  . cephALEXin (KEFLEX) 500 MG capsule Take 1 capsule (500 mg total) by mouth 4 (four) times daily. 28 capsule 0  . clopidogrel (PLAVIX) 75 MG tablet TAKE 1 TABLET DAILY 90 tablet 3  . gabapentin (NEURONTIN) 100 MG capsule Take up to 3 capsules qHS as needed    . JARDIANCE 25 MG TABS tablet Take 1 tablet by mouth daily.    Marland Kitchen lisinopril (PRINIVIL,ZESTRIL) 20 MG tablet Take 0.5 tablets (10 mg total) by mouth daily. 90 tablet 3  . metFORMIN (GLUCOPHAGE) 500 MG tablet Take 500 mg 2 (two) times daily with a meal by mouth.     . nitroGLYCERIN (NITROSTAT) 0.4 MG SL tablet Place 1 tablet (0.4 mg total) under the tongue every 5 (five) minutes as needed for chest pain. 25 tablet 6  . pantoprazole (PROTONIX) 40 MG tablet Take 40 mg by mouth daily.    Marland Kitchen lisinopril (ZESTRIL) 10 MG tablet Take 10 mg by mouth daily.     No facility-administered medications prior to visit.    Allergies:   Parafon forte dsc [chlorzoxazone], Chlorzoxazone, Septra [bactrim], and Sulfamethoxazole-trimethoprim   Social History   Tobacco Use  . Smoking status: Never Smoker  . Smokeless tobacco: Never Used  Substance Use Topics  . Alcohol use: Yes    Comment: SOCIAL  . Drug use: No     Review of Systems  Constitutional: Positive for fever. Negative for chills.  HENT: Positive for congestion and sinus pain.   Respiratory: Positive for cough.   Cardiovascular: Negative for chest pain.  Genitourinary: Negative for dysuria.  Musculoskeletal: Negative for myalgias.  Neurological: Negative.      Labs/Other Tests and Data Reviewed:    Recent Labs: No results found for requested labs within last 8760 hours.   Recent Lipid Panel Lab Results  Component Value  Date/Time   CHOL 142 09/07/2017 08:02 AM   TRIG 147 09/07/2017 08:02 AM   HDL 41 09/07/2017 08:02 AM   CHOLHDL 3.5 09/07/2017 08:02 AM   CHOLHDL 3.9 06/30/2015 08:40 AM   LDLCALC 72 09/07/2017 08:02 AM    Wt Readings from Last 3 Encounters:  12/22/20 195 lb (88.5 kg)  09/29/20 197 lb (89.4 kg)  12/25/19 196 lb 12.8 oz (89.3 kg)     Objective:    Vital Signs:  BP 130/80   Ht 5\' 7"  (1.702 m)   Wt 195 lb (88.5 kg)   BMI 30.54 kg/m    Physical Exam reviewed  ASSESSMENT & PLAN:   Diagnoses and all  orders for this visit: Acute non-recurrent sinusitis of other sinus -     azithromycin (ZITHROMAX) 250 MG tablet; 2 tablets on day 1, then 1 tablet daily on days 2-6. At increased risk of exposure to COVID-19 virus -     POC COVID-19 Covid positive, needs to quarantine for at least 5 days         COVID-19 Education: The signs and symptoms of COVID-19 were discussed with the patient and how to seek care for testing (follow up with PCP or arrange E-visit). The importance of social distancing was discussed today.   I spent 15 minutes dedicated to the care of this patient on the date of this encounter to include face-to-face time with the patient, as well as:   Follow Up:  In Person prn  Signed,  Brent Bulla, MD  12/22/2020 8:27 AM    [facility]

## 2020-12-22 NOTE — Progress Notes (Signed)
Patient has Covid, must quarantine,  can recheck in 5 days lp

## 2020-12-25 ENCOUNTER — Other Ambulatory Visit: Payer: Self-pay | Admitting: Legal Medicine

## 2021-03-15 ENCOUNTER — Telehealth: Payer: Self-pay | Admitting: Cardiology

## 2021-03-15 MED ORDER — CLOPIDOGREL BISULFATE 75 MG PO TABS: 1.0000 | ORAL_TABLET | Freq: Every day | ORAL | 1 refills | Status: DC

## 2021-03-15 NOTE — Telephone Encounter (Signed)
*  STAT* If patient is at the pharmacy, call can be transferred to refill team.   1. Which medications need to be refilled? (please list name of each medication and dose if known)  clopidogrel (PLAVIX) 75 MG tablet  2. Which pharmacy/location (including street and city if local pharmacy) is medication to be sent to? [facility] [phone] - [place], [place] - [address] AT [address]  3. Do they need a 30 day or 90 day supply? 90 with refills

## 2021-05-26 ENCOUNTER — Other Ambulatory Visit: Payer: Self-pay

## 2021-05-26 MED ORDER — PANTOPRAZOLE SODIUM 40 MG PO TBEC
40.0000 mg | DELAYED_RELEASE_TABLET | Freq: Every day | ORAL | 1 refills | Status: DC
Start: 2021-05-26 — End: 2021-11-21

## 2021-07-07 ENCOUNTER — Other Ambulatory Visit: Payer: Self-pay | Admitting: Cardiology

## 2021-08-05 ENCOUNTER — Other Ambulatory Visit: Payer: Self-pay | Admitting: Cardiology

## 2021-09-02 ENCOUNTER — Other Ambulatory Visit: Payer: Self-pay | Admitting: Cardiology

## 2021-10-11 ENCOUNTER — Other Ambulatory Visit: Payer: Self-pay

## 2021-10-11 ENCOUNTER — Ambulatory Visit (INDEPENDENT_AMBULATORY_CARE_PROVIDER_SITE_OTHER): Payer: TRICARE For Life (TFL) | Admitting: Legal Medicine

## 2021-10-11 ENCOUNTER — Encounter: Payer: Self-pay | Admitting: Legal Medicine

## 2021-10-11 VITALS — BP 140/80 | HR 95 | Temp 98.1°F | Ht 67.0 in | Wt 189.0 lb

## 2021-10-11 DIAGNOSIS — E1169 Type 2 diabetes mellitus with other specified complication: Secondary | ICD-10-CM

## 2021-10-11 DIAGNOSIS — B029 Zoster without complications: Secondary | ICD-10-CM | POA: Diagnosis not present

## 2021-10-11 DIAGNOSIS — E669 Obesity, unspecified: Secondary | ICD-10-CM | POA: Diagnosis not present

## 2021-10-11 MED ORDER — PREDNISONE 10 MG (21) PO TBPK: ORAL_TABLET | ORAL | 0 refills | Status: DC

## 2021-10-11 MED ORDER — VALACYCLOVIR HCL 1 G PO TABS: 1000.0000 mg | ORAL_TABLET | Freq: Two times a day (BID) | ORAL | 1 refills | Status: DC

## 2021-10-11 NOTE — Progress Notes (Signed)
Acute Office Visit  Subjective:    Patient ID: Kristopher Jackson, male    DOB: May 26, 1956, 65 y.o.   MRN: [id number]  Chief Complaint  Patient presents with   Herpes Zoster    HPI: Patient is in today for rash on his left eye since 2 days ago. He wants to be check for shingles.He has papules that burn in eye lash on left side, no lesions on eye lid.  Eye exam normal.  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   Coronary artery disease 02/09/2012   99% early Mid LAD (@Diag ); not on blocker due to bradycardia   Diabetes mellitus    GERD (gastroesophageal reflux disease)    H/O class III angina pectoris 02/2012   - Referred for cardiac cath   H/O: pneumonia    Hypertension    Presence of stent in LAD coronary artery - 02/09/2012    Integrity Resolute Drug Eluting Stent (3.0 mm x 26 mm -> 3.3 mm), mid LAD, 02/09/2012         Unstable angina pectoris - history of 02/06/2012    Past Surgical History:  Procedure Laterality Date   CORONARY ANGIOPLASTY WITH STENT PLACEMENT  02/09/2012    80% LAD, (AT branch-point with major diagonal (D2) AND SEPTAL PERFORATOR (SP2) with a IntegrityResolute DES 3.0 mm x 26 mm; final diameter 3.3 mm proximal  & mid ; 3.1 mm distal    DOPPLER ECHOCARDIOGRAPHY  02/07/2012   EF=>55% BODERLINE LVH,NORMAL DIASTOLIC FUNCTION   LEFT HEART CATHETERIZATION WITH CORONARY ANGIOGRAM N/A 02/09/2012   Procedure: LEFT HEART CATHETERIZATION WITH CORONARY ANGIOGRAM;  Surgeon: 04/10/2012, MD;  Location: [facility] CATH LAB;  Service: Cardiovascular;  Laterality: N/A;   NM MYOVIEW LTD  09/2017   Exercise 8:35 min, peak HR 141 bpm (88% MPHR).  Hypertensive response with blood pressure going from 107/78 up to 158/117 mmHg.  10.4 METs.   Stopped due to fatigue and weakness.  Also noted shortness of breath.  No ischemia or infarct noted.   EF ~60%.   TONSILLECTOMY      Family History  Problem Relation Age of Onset   Heart attack Father 7    Social History    Socioeconomic History   Marital status: Married    Spouse name: Not on file   Number of children: Not on file   Years of education: Not on file   Highest education level: Not on file  Occupational History   Not on file  Tobacco Use   Smoking status: Never   Smokeless tobacco: Never  Substance and Sexual Activity   Alcohol use: Yes    Comment: SOCIAL   Drug use: No   Sexual activity: Yes  Other Topics Concern   Not on file  Social History Narrative   Married father of one. Exercises routinely without significant symptoms. 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   He does not smoke.   He drinks up to 3 alcohol drinks a week.   Social Determinants of Health   Financial Resource Strain: Not on file  Food Insecurity: Not on file  Transportation Needs: Not on file  Physical Activity: Not on file  Stress: Not on file  Social Connections: Not on file  Intimate Partner Violence: Not on file    Outpatient Medications Prior to Visit  Medication Sig Dispense Refill   atorvastatin (LIPITOR)  40 MG tablet Take 1 tablet (40 mg total) by mouth daily. 90 tablet 1   clopidogrel (PLAVIX) 75 MG tablet      fluticasone (FLONASE) 50 MCG/ACT nasal spray Administer 1 spray in each nostril daily for 30 days.     gabapentin (NEURONTIN) 100 MG capsule Take up to 3 capsules qHS as needed     glucose blood (FREESTYLE TEST STRIPS) test strip Check BG 2 times/day.  Dx E11.65     JARDIANCE 25 MG TABS tablet Take 1 tablet by mouth daily.     lisinopril (PRINIVIL,ZESTRIL) 20 MG tablet Take 0.5 tablets (10 mg total) by mouth daily. 90 tablet 3   lisinopril (ZESTRIL) 10 MG tablet TAKE 1 TABLET DAILY 90 tablet 2   metFORMIN (GLUCOPHAGE) 500 MG tablet Take 500 mg 2 (two) times daily with a meal by mouth.      mupirocin ointment (BACTROBAN) 2 %      nitroGLYCERIN (NITROSTAT) 0.4 MG SL tablet Place 1 tablet (0.4 mg total) under the  tongue every 5 (five) minutes as needed for chest pain. 25 tablet 6   pantoprazole (PROTONIX) 40 MG tablet Take 1 tablet (40 mg total) by mouth daily. 90 tablet 1   prednisoLONE acetate (PRED FORTE) 1 % ophthalmic suspension      azithromycin (ZITHROMAX) 250 MG tablet 2 tablets on day 1, then 1 tablet daily on days 2-6. 6 tablet 0   cephALEXin (KEFLEX) 500 MG capsule Take 1 capsule (500 mg total) by mouth 4 (four) times daily. 28 capsule 0   guaiFENesin-codeine (CHERATUSSIN AC) 100-10 MG/5ML syrup Take 5 mLs by mouth 3 (three) times daily as needed for cough. 120 mL 0   No facility-administered medications prior to visit.    Allergies  Allergen Reactions   Parafon Forte Dsc [Chlorzoxazone]    Chlorzoxazone Nausea And Vomiting   Septra [Bactrim] Rash and Other (See Comments)    Found out in [employer]. Rash itch   Sulfamethoxazole-Trimethoprim Rash    Found out in [employer].    Review of Systems  Constitutional:  Negative for chills, fatigue, fever and unexpected weight change.  HENT:  Negative for congestion, ear pain, sinus pain and sore throat.   Respiratory:  Negative for cough and shortness of breath.   Cardiovascular:  Negative for chest pain and palpitations.  Gastrointestinal:  Negative for abdominal pain, blood in stool, constipation, diarrhea, nausea and vomiting.  Endocrine: Negative for polydipsia.  Genitourinary:  Negative for dysuria.  Musculoskeletal:  Negative for back pain.  Skin:  Positive for rash.  Neurological:  Negative for headaches.      Objective:    Physical Exam Vitals reviewed.  Constitutional:      Appearance: Normal appearance.  HENT:     Right Ear: Tympanic membrane normal.     Left Ear: Tympanic membrane normal.     Nose: Nose normal.     Mouth/Throat:     Mouth: Mucous membranes are moist.  Eyes:     Extraocular Movements: Extraocular movements intact.     Conjunctiva/sclera: Conjunctivae normal.     Pupils: Pupils are equal, round, and  reactive to light.  Cardiovascular:     Rate and Rhythm: Normal rate.     Pulses: Normal pulses.     Heart sounds: Normal heart sounds. No murmur heard.   No gallop.  Pulmonary:     Effort: Pulmonary effort is normal. No respiratory distress.     Breath sounds: No wheezing.  Musculoskeletal:  General: Normal range of motion.     Cervical back: Normal range of motion.  Skin:         Comments: Inflammatory papules over left eye.  PERLA, he is to see eye doctor  Neurological:     Mental Status: He is alert.    BP 140/80   Pulse 95   Temp 98.1 F (36.7 C)   Ht 5\' 7"  (1.702 m)   Wt 189 lb (85.7 kg)   SpO2 97%   BMI 29.60 kg/m  Wt Readings from Last 3 Encounters:  10/11/21 189 lb (85.7 kg)  12/22/20 195 lb (88.5 kg)  09/29/20 197 lb (89.4 kg)    Health Maintenance Due  Topic Date Due   HEMOGLOBIN A1C  Never done   Pneumonia Vaccine 22+ Years old (1 - PCV) Never done   FOOT EXAM  Never done   OPHTHALMOLOGY EXAM  Never done   HIV Screening  Never done   Hepatitis C Screening  Never done   TETANUS/TDAP  Never done   Zoster Vaccines- Shingrix (1 of 2) Never done   COVID-19 Vaccine (3 - Booster for Pfizer series) 05/27/2020   INFLUENZA VACCINE  07/05/2021    There are no preventive care reminders to display for this patient.   No results found for: TSH Lab Results  Component Value Date   WBC 9.6 06/30/2015   HGB 13.8 06/30/2015   HCT 40.9 06/30/2015   MCV 86.7 06/30/2015   PLT 194 06/30/2015   Lab Results  Component Value Date   NA 137 09/07/2017   K 4.7 09/07/2017   CO2 22 09/07/2017   GLUCOSE 326 (H) 09/07/2017   BUN 14 09/07/2017   CREATININE 1.27 09/07/2017   BILITOT 0.6 10/10/2017   ALKPHOS 97 10/10/2017   AST 28 10/10/2017   ALT 45 (H) 10/10/2017   PROT 5.8 (L) 10/10/2017   ALBUMIN 4.3 10/10/2017   CALCIUM 9.5 09/07/2017   Lab Results  Component Value Date   CHOL 142 09/07/2017   Lab Results  Component Value Date   HDL 41 09/07/2017    Lab Results  Component Value Date   LDLCALC 72 09/07/2017   Lab Results  Component Value Date   TRIG 147 09/07/2017   Lab Results  Component Value Date   CHOLHDL 3.5 09/07/2017   No results found for: HGBA1C     Assessment & Plan:   Problem List Items Addressed This Visit       Endocrine   Diabetes mellitus type 2 in obese (HCC) (Chronic) An individual care plan for diabetes was established and reinforced today.  The patient's status was assessed using clinical findings on exam, labs and diagnostic testing. Patient success at meeting goals based on disease specific evidence-based guidelines and found to be good controlled. Medications were assessed and patient's understanding of the medical issues , including barriers were assessed. Recommend adherence to a diabetic diet, a graduated exercise program, HgbA1c level is checked quarterly, and urine microalbumin performed yearly .  Annual mono-filament sensation testing performed. Lower blood pressure and control hyperlipidemia is important. Get annual eye exams and annual flu shots and smoking cessation discussed.  Self management goals were discussed.    Other Visit Diagnoses     Herpes zoster without complication    -  Primary   Relevant Medications   mupirocin ointment (BACTROBAN) 2 %   valACYclovir (VALTREX) 1000 MG tablet   predniSONE (STERAPRED UNI-PAK 21 TAB) 10 MG (21) TBPK tablet Treat  herpes with valcyclovir and prednisone, he says he will call eye doctor      Meds ordered this encounter  Medications   valACYclovir (VALTREX) 1000 MG tablet    Sig: Take 1 tablet (1,000 mg total) by mouth 2 (two) times daily.    Dispense:  20 tablet    Refill:  1   predniSONE (STERAPRED UNI-PAK 21 TAB) 10 MG (21) TBPK tablet    Sig: Take 6ills first day , then 5 pills day 2 and then cut down one pill day until gone    Dispense:  21 tablet    Refill:  0         Follow-up: Return in about 1 week (around 10/18/2021) for  shingles.  An After Visit Summary was printed and given to the patient.  Brent Bulla, MD [facility] [phone]

## 2021-11-02 ENCOUNTER — Other Ambulatory Visit: Payer: Self-pay | Admitting: Cardiology

## 2021-11-20 ENCOUNTER — Other Ambulatory Visit: Payer: Self-pay | Admitting: Legal Medicine

## 2022-02-26 ENCOUNTER — Other Ambulatory Visit: Payer: Self-pay | Admitting: Cardiology

## 2022-04-27 ENCOUNTER — Telehealth: Payer: Self-pay

## 2022-04-27 NOTE — Telephone Encounter (Signed)
Pt has appt 05/03/22 with Edd Fabian, FNP for pre op clearance. I will send FYI to requesting office the pt has appt. Will send clearance notes to FNP for upcoming appt.

## 2022-04-27 NOTE — Telephone Encounter (Signed)
Primary Cardiologist:David Herbie [place], MD  Chart reviewed as part of pre-operative protocol coverage. Because of Kristopher Jackson past medical history and time since last visit, he/she will require a follow-up visit in order to better assess preoperative cardiovascular risk.  Pre-op covering staff: - Please schedule appointment and call patient to inform them. - Please contact requesting surgeon's office via preferred method (i.e, phone, fax) to inform them of need for appointment prior to surgery.  If applicable, this message will also be routed to pharmacy pool and/or primary cardiologist for input on holding anticoagulant/antiplatelet agent as requested below so that this information is available at time of patient's appointment.   Ronney Asters, NP  04/27/2022, 11:22 AM

## 2022-04-27 NOTE — Telephone Encounter (Signed)
   Pre-operative Risk Assessment    Patient Name: Kristopher Jackson  DOB: 15-Nov-1956 MRN: [id number]      Request for Surgical Clearance    Procedure:   Left Hindfoot Fusion using plate and screws with Bone spur removal   Date of Surgery:  Clearance 05/13/22                                 Surgeon:  Dr. Avel Sensor Dial  Surgeon's Group or Practice Name:  [facility] Phone number:  [phone] Fax number:  [phone]   Type of Clearance Requested:   - Pharmacy:  Hold Clopidogrel (Plavix) prior to procedure    Type of Anesthesia:  Not Indicated   Additional requests/questions:    Deforest Hoyles   04/27/2022, 10:37 AM

## 2022-05-02 NOTE — Progress Notes (Unsigned)
Cardiology Clinic Note   Patient Name: Kristopher Jackson Date of Encounter: 05/03/2022  Primary Care Provider:  No primary care provider on file. Primary Cardiologist:  Glenetta Hew, MD  Patient Profile    Kristopher Jackson presents to the clinic today for follow-up evaluation of his coronary artery disease and preoperative cardiac evaluation.  Past Medical History    Past Medical History:  Diagnosis Date   Atypical angina ([place]) 08/30/2017   Coronary artery disease 02/09/2012   99% early Mid LAD (@Diag ); not on blocker due to bradycardia   Diabetes mellitus    GERD (gastroesophageal reflux disease)    H/O class III angina pectoris 02/2012   - Referred for cardiac cath   H/O: pneumonia    Hypertension    Presence of stent in LAD coronary artery - 02/09/2012    Integrity Resolute Drug Eluting Stent (3.0 mm x 26 mm -> 3.3 mm), mid LAD, 02/09/2012         Unstable angina pectoris - history of 02/06/2012   Past Surgical History:  Procedure Laterality Date   CORONARY ANGIOPLASTY WITH STENT PLACEMENT  02/09/2012    80% LAD, (AT branch-point with major diagonal (D2) AND SEPTAL PERFORATOR (SP2) with a IntegrityResolute DES 3.0 mm x 26 mm; final diameter 3.3 mm proximal  & mid ; 3.1 mm distal    DOPPLER ECHOCARDIOGRAPHY  02/07/2012   EF=>55% BODERLINE LVH,NORMAL DIASTOLIC FUNCTION   LEFT HEART CATHETERIZATION WITH CORONARY ANGIOGRAM N/A 02/09/2012   Procedure: LEFT HEART CATHETERIZATION WITH CORONARY ANGIOGRAM;  Surgeon: Leonie Man, MD;  Location: [facility] CATH LAB;  Service: Cardiovascular;  Laterality: N/A;   NM MYOVIEW LTD  09/2017   Exercise 8:35 min, peak HR 141 bpm (88% MPHR).  Hypertensive response with blood pressure going from 107/78 up to 158/117 mmHg.  10.4 METs.   Stopped due to fatigue and weakness.  Also noted shortness of breath.  No ischemia or infarct noted.   EF ~60%.   TONSILLECTOMY      Allergies  Allergies  Allergen Reactions   Parafon Forte Dsc [Chlorzoxazone]    Chlorzoxazone  Nausea And Vomiting   Septra [Bactrim] Rash and Other (See Comments)    Found out in [employer] Rash itch   Sulfamethoxazole-Trimethoprim Rash    Found out in [employer]    History of Present Illness    [name] has a PMH of essential hypertension, coronary artery disease status post cardiac catheterization with PCI and DES placement to his LAD in 2013, type 2 diabetes, obesity, metabolic syndrome, hyperlipidemia, history of abnormal LFTs, and cellulitis.  History of bradycardia on beta-blocker.  He was last seen by Dr. Ellyn Hack on 12/25/2019.  During that time he remained stable from a cardiac standpoint.  He denied heart failure and anginal symptoms.  His blood pressure was well controlled.  He had lost weight and his BMI was less than 30.  He was tolerating lisinopril better at the reduced dose of 10 mg.  He presents to the clinic today for follow-up evaluation and preoperative cardiac evaluation.  He states he feels well.  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  He has upcoming left ankle and foot surgery.  We reviewed his most recent lipid panel which shows an LDL of 54.  I will give him a salty 6 diet sheet, have him maintain his physical activity and plan follow-up in 12 months.  Today he denies chest pain, shortness of breath, lower extremity edema, fatigue,  palpitations, melena, hematuria, hemoptysis, diaphoresis, weakness, presyncope, syncope, orthopnea, and PND.   Home Medications    Prior to Admission medications   Medication Sig Start Date End Date Taking? Authorizing Provider  atorvastatin (LIPITOR) 40 MG tablet Take 1 tablet (40 mg total) by mouth daily. 12/28/17   Leonie Man, MD  clopidogrel (PLAVIX) 75 MG tablet TAKE 1 TABLET(75 MG) BY MOUTH DAILY 11/02/21   Leonie Man, MD  fluticasone [facility]) 50 MCG/ACT nasal spray Administer 1 spray in each nostril daily for 30 days. 09/09/21   [provider]  gabapentin (NEURONTIN)  100 MG capsule Take up to 3 capsules qHS as needed 04/24/20   [provider]  glucose blood (FREESTYLE TEST STRIPS) test strip Check BG 2 times/day.  Dx E11.65 09/14/21   [provider]  JARDIANCE 25 MG TABS tablet Take 1 tablet by mouth daily. 12/26/16   [provider]  lisinopril (PRINIVIL,ZESTRIL) 20 MG tablet Take 0.5 tablets (10 mg total) by mouth daily. 08/30/17   Leonie Man, MD  lisinopril (ZESTRIL) 10 MG tablet TAKE 1 TABLET DAILY 12/25/20   Lillard Anes, MD  metFORMIN (GLUCOPHAGE) 500 MG tablet Take 500 mg 2 (two) times daily with a meal by mouth.     [provider]  mupirocin ointment (BACTROBAN) 2 %  08/13/20   [provider]  nitroGLYCERIN (NITROSTAT) 0.4 MG SL tablet Place 1 tablet (0.4 mg total) under the tongue every 5 (five) minutes as needed for chest pain. 02/27/14   Leonie Man, MD  pantoprazole (PROTONIX) 40 MG tablet TAKE 1 TABLET(40 MG) BY MOUTH DAILY 11/21/21   Lillard Anes, MD  prednisoLONE acetate (PRED FORTE) 1 % ophthalmic suspension  06/09/21   [provider]  predniSONE (STERAPRED UNI-PAK 21 TAB) 10 MG (21) TBPK tablet Take 6ills first day , then 5 pills day 2 and then cut down one pill day until gone 10/11/21   Lillard Anes, MD  valACYclovir (VALTREX) 1000 MG tablet Take 1 tablet (1,000 mg total) by mouth 2 (two) times daily. 10/11/21   Lillard Anes, MD    Family History    Family History  Problem Relation Age of Onset   Heart attack Father 66   He indicated that his mother is deceased. He indicated that his father is deceased. He indicated that his sister is alive. He indicated that both of his brothers are alive. He indicated that his maternal grandmother is deceased. He indicated that his maternal grandfather is deceased. He indicated that his paternal grandmother is deceased. He indicated that his paternal grandfather is deceased. He indicated that his daughter is  alive.  Social History    Social History   Socioeconomic History   Marital status: Married    Spouse name: Not on file   Number of children: Not on file   Years of education: Not on file   Highest education level: Not on file  Occupational History   Not on file  Tobacco Use   Smoking status: Never   Smokeless tobacco: Never  Substance and Sexual Activity   Alcohol use: Yes    Comment: SOCIAL   Drug use: No   Sexual activity: Yes  Other Topics Concern   Not on file  Social History Narrative   Married father of one. Exercises routinely without significant symptoms. 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  He does not smoke.   He drinks up to 3 alcohol drinks a week.   Social Determinants of Health   Financial Resource Strain: Not on file  Food Insecurity: Not on file  Transportation Needs: Not on file  Physical Activity: Not on file  Stress: Not on file  Social Connections: Not on file  Intimate Partner Violence: Not on file     Review of Systems    General:  No chills, fever, night sweats or weight changes.  Cardiovascular:  No chest pain, dyspnea on exertion, edema, orthopnea, palpitations, paroxysmal nocturnal dyspnea. Dermatological: No rash, lesions/masses Respiratory: No cough, dyspnea Urologic: No hematuria, dysuria Abdominal:   No nausea, vomiting, diarrhea, bright red blood per rectum, melena, or hematemesis Neurologic:  No visual changes, wkns, changes in mental status. All other systems reviewed and are otherwise negative except as noted above.  Physical Exam    VS:  BP 124/78   Pulse 64   Ht 5\' 7"  (1.702 m)   Wt 190 lb (86.2 kg)   SpO2 97%   BMI 29.76 kg/m  , BMI Body mass index is 29.76 kg/m. GEN: Well nourished, well developed, in no acute distress. HEENT: normal. Neck: Supple, no JVD, carotid bruits, or masses. Cardiac: RRR, no murmurs, rubs, or gallops. No  clubbing, cyanosis, edema.  Radials/DP/PT 2+ and equal bilaterally.  Respiratory:  Respirations regular and unlabored, clear to auscultation bilaterally. GI: Soft, nontender, nondistended, BS + x 4. MS: no deformity or atrophy. Skin: warm and dry, no rash. Neuro:  Strength and sensation are intact. Psych: Normal affect.  Accessory Clinical Findings    Recent Labs: No results found for requested labs within last 8760 hours.   Recent Lipid Panel    Component Value Date/Time   CHOL 142 09/07/2017 0802   TRIG 147 09/07/2017 0802   HDL 41 09/07/2017 0802   CHOLHDL 3.5 09/07/2017 0802   CHOLHDL 3.9 06/30/2015 0840   VLDL 17 06/30/2015 0840   LDLCALC 72 09/07/2017 0802    ECG personally reviewed by me today-normal sinus rhythm left anterior fascicular block inferior infarct undetermined age 90 bpm- No acute changes  Echocardiogram 02/07/2012 EF=>55% BODERLINE LVH,NORMAL DIASTOLIC FUNCTION  Nuclear stress test 09/07/2017  Nuclear stress EF: 51%. visually, the LV EF appears to be more like 65% and is normal . Suggest echo for assessment of LV function The study is normal. This is a low risk study.  Assessment & Plan   1.   Coronary artery disease-no recent episodes of arm neck back or chest discomfort.  Remains physically active.  Underwent cardiac catheterization which showed 80% mid LAD status post PCI with DES, 30-40% RCA, circumflex anomalous takeoff 2013 Continue clopidogrel, atorvastatin, Jardiance, metformin Heart healthy low-sodium diet-salty 6 given Increase physical activity as tolerated   Hyperlipidemia-LDL 54 on 10/22 Continue clopidogrel, atorvastatin Heart healthy low-sodium high-fiber diet.   Increase physical activity as tolerated Repeat fasting lipids and LFTs  Essential hypertension-BP today 124/78.  Well-controlled at home. Continue lisinopril Heart healthy low-sodium diet-salty 6 given Increase physical activity as tolerated   Preoperative cardiac  evaluation-left hindfoot fusion using plate and screws with bone spur removal, 05/13/2022, Dr. Delora Fuel  Primary Cardiologist: Glenetta Hew, MD  Chart reviewed as part of pre-operative protocol coverage. Given past medical history and time since last visit, based on ACC/AHA guidelines, Kalobe Batman would be at acceptable risk for the planned procedure without further cardiovascular testing.   Patient was advised that if he develops new symptoms prior  to surgery to contact our office to arrange a follow-up appointment.  He verbalized understanding.  He is low risk from a cardiac standpoint.  He is able to complete greater than 4 METS of physical activity.  His Plavix may be held for 5 to 7 days prior to his procedure.  Please resume as soon as hemostasis is achieved.   Disposition: Follow-up with Dr. Ellyn Hack or me in 12 months.      Jossie Ng. Yarisa Lynam NP-C    05/03/2022, 8:59 AM [place] [address] Office [phone] Fax [phone]  Notice: This dictation was prepared with Dragon dictation along with smaller phrase technology. Any transcriptional errors that result from this process are unintentional and may not be corrected upon review.  I spent 14 minutes examining this patient, reviewing medications, and using patient centered shared decision making involving her cardiac care.  Prior to her visit I spent greater than 20 minutes reviewing her past medical history,  medications, and prior cardiac tests.

## 2022-05-03 ENCOUNTER — Encounter: Payer: Self-pay | Admitting: General Practice

## 2022-05-03 ENCOUNTER — Ambulatory Visit (INDEPENDENT_AMBULATORY_CARE_PROVIDER_SITE_OTHER): Payer: Medicare Other | Admitting: General Practice

## 2022-05-03 VITALS — BP 124/78 | HR 64 | Ht 67.0 in | Wt 190.0 lb

## 2022-05-03 DIAGNOSIS — I10 Essential (primary) hypertension: Secondary | ICD-10-CM

## 2022-05-03 DIAGNOSIS — I2510 Atherosclerotic heart disease of native coronary artery without angina pectoris: Secondary | ICD-10-CM

## 2022-05-03 DIAGNOSIS — Z01810 Encounter for preprocedural cardiovascular examination: Secondary | ICD-10-CM | POA: Diagnosis not present

## 2022-05-03 DIAGNOSIS — E782 Mixed hyperlipidemia: Secondary | ICD-10-CM

## 2022-05-03 NOTE — Patient Instructions (Signed)
Medication Instructions:  The current medical regimen is effective;  continue present plan and medications as directed. Please refer to the Current Medication list given to you today.   *If you need a refill on your cardiac medications before your next appointment, please call your pharmacy*  Lab Work:   Testing/Procedures:  NONE    NONE  If you have labs (blood work) drawn today and your tests are completely normal, you will receive your results only by: MyChart Message (if you have MyChart) OR  A paper copy in the mail If you have any lab test that is abnormal or we need to change your treatment, we will call you to review the results.  Special Instructions PLEASE READ AND FOLLOW SALTY 6-ATTACHED-1,800mg  daily  PLEASE MAINTAIN PHYSICAL ACTIVITY AS TOLERATED   Cleared for Left [place] Fusion  Follow-Up: Your next appointment:  12 month(s) In Person with Bryan Lemma, MD   Please call our office 2 months in advance to schedule this appointment  :1  At [facility], you and your health needs are our priority.  As part of our continuing mission to provide you with exceptional heart care, we have created designated Provider Care Teams.  These Care Teams include your primary Cardiologist (physician) and Advanced Practice Providers (APPs -  Physician Assistants and Nurse Practitioners) who all work together to provide you with the care you need, when you need it.  We recommend signing up for the patient portal called "MyChart".  Sign up information is provided on this After Visit Summary.  MyChart is used to connect with patients for Virtual Visits (Telemedicine).  Patients are able to view lab/test results, encounter notes, upcoming appointments, etc.  Non-urgent messages can be sent to your provider as well.   To learn more about what you can do with MyChart, go to [email or web address].     Important Information About Sugar               6 SALTY THINGS TO AVOID     1,800MG   DAILY

## 2022-08-18 ENCOUNTER — Other Ambulatory Visit: Payer: Self-pay | Admitting: Legal Medicine

## 2022-08-24 ENCOUNTER — Ambulatory Visit: Payer: Medicare Other | Attending: Cardiology | Admitting: Cardiology

## 2022-08-24 ENCOUNTER — Telehealth: Payer: Self-pay | Admitting: General Practice

## 2022-08-24 ENCOUNTER — Encounter: Payer: Self-pay | Admitting: Cardiology

## 2022-08-24 VITALS — BP 99/73 | HR 90 | Ht 67.0 in | Wt 175.4 lb

## 2022-08-24 DIAGNOSIS — E785 Hyperlipidemia, unspecified: Secondary | ICD-10-CM | POA: Insufficient documentation

## 2022-08-24 DIAGNOSIS — R072 Precordial pain: Secondary | ICD-10-CM | POA: Diagnosis present

## 2022-08-24 DIAGNOSIS — R0609 Other forms of dyspnea: Secondary | ICD-10-CM | POA: Diagnosis present

## 2022-08-24 DIAGNOSIS — I10 Essential (primary) hypertension: Secondary | ICD-10-CM | POA: Diagnosis present

## 2022-08-24 DIAGNOSIS — E1169 Type 2 diabetes mellitus with other specified complication: Secondary | ICD-10-CM | POA: Diagnosis present

## 2022-08-24 DIAGNOSIS — E8881 Metabolic syndrome: Secondary | ICD-10-CM | POA: Diagnosis present

## 2022-08-24 DIAGNOSIS — Z955 Presence of coronary angioplasty implant and graft: Secondary | ICD-10-CM

## 2022-08-24 DIAGNOSIS — I25119 Atherosclerotic heart disease of native coronary artery with unspecified angina pectoris: Secondary | ICD-10-CM | POA: Insufficient documentation

## 2022-08-24 DIAGNOSIS — I25110 Atherosclerotic heart disease of native coronary artery with unstable angina pectoris: Secondary | ICD-10-CM

## 2022-08-24 HISTORY — PX: NM MYOVIEW LTD: HXRAD82

## 2022-08-24 MED ORDER — LISINOPRIL 5 MG PO TABS: 5.0000 mg | ORAL_TABLET | Freq: Every day | ORAL | 4 refills | Status: DC

## 2022-08-24 NOTE — Progress Notes (Unsigned)
Primary Care Provider: Elza Rafter, [place] Cardiologist: Glenetta Hew, MD Electrophysiologist: None  Clinic Note: No chief complaint on file.  ===================================  ASSESSMENT/PLAN   Problem List Items Addressed This Visit       Cardiology Problems   CAD (coronary artery disease), native coronary artery - 80% mid LAD - s/p PCI with DES with distal ~50%, 30-40% RCA & LCx (anomalous LCx takeoff) - Primary (Chronic)   Essential hypertension (Chronic)   Hyperlipidemia associated with type 2 diabetes mellitus (HCC) (Chronic)   Relevant Medications   glimepiride (AMARYL) 4 MG tablet     Other   Presence of stent in LAD coronary artery - Integrity Resolute Drug Eluting Stent, mid LAD, 02/09/2012 (Chronic)   DOE (dyspnea on exertion)   Precordial pain    ===================================  HPI:    Kristopher Jackson is a 66 y.o. male with a PMH notable for CAD-LAD PCI (March 2013), along with DM-2, HTN HLD below who presents today for ***.  Brando Licari was last seen on ***  Recent Hospitalizations: ***  Reviewed  CV studies:    The following studies were reviewed today: (if available, images/films reviewed: From Epic Chart or Care Everywhere) ***:   Interval History:   Domenic Distasio   CV Review of Symptoms (Summary) Cardiovascular ROS: {roscv:310661}  REVIEWED OF SYSTEMS   ROS  I have reviewed and (if needed) personally updated the patient's problem list, medications, allergies, past medical and surgical history, social and family history.   PAST MEDICAL HISTORY   Past Medical History:  Diagnosis Date   Atypical angina ([place]) 08/30/2017   Coronary artery disease 02/09/2012   99% early Mid LAD (@Diag ); not on blocker due to bradycardia   Diabetes mellitus    Essential hypertension 02/06/2012   GERD (gastroesophageal reflux disease)    H/O class III angina pectoris 02/2012   - Referred for cardiac cath   H/O: pneumonia     Hyperlipidemia associated with type 2 diabetes mellitus ([place]) 03/01/2014   Hypertension    Presence of stent in LAD coronary artery - 02/09/2012    Integrity Resolute Drug Eluting Stent (3.0 mm x 26 mm -> 3.3 mm), mid LAD, 02/09/2012         Unstable angina pectoris - history of 02/06/2012    PAST SURGICAL HISTORY   Past Surgical History:  Procedure Laterality Date   CORONARY ANGIOPLASTY WITH STENT PLACEMENT  02/09/2012    80% LAD, (AT branch-point with major diagonal (D2) AND SEPTAL PERFORATOR (SP2) with a IntegrityResolute DES 3.0 mm x 26 mm; final diameter 3.3 mm proximal  & mid ; 3.1 mm distal    DOPPLER ECHOCARDIOGRAPHY  02/07/2012   EF=>55% BODERLINE LVH,NORMAL DIASTOLIC FUNCTION   LEFT HEART CATHETERIZATION WITH CORONARY ANGIOGRAM N/A 02/09/2012   Procedure: LEFT HEART CATHETERIZATION WITH CORONARY ANGIOGRAM;  Surgeon: Leonie Man, MD;  [facility];  Service: Cardiovascular;  Laterality: N/A;   NM MYOVIEW LTD  09/2017   Exercise 8:35 min, peak HR 141 bpm (88% MPHR).  Hypertensive response with blood pressure going from 107/78 up to 158/117 mmHg.  10.4 METs.   Stopped due to fatigue and weakness.  Also noted shortness of breath.  No ischemia or infarct noted.   EF ~60%.   TONSILLECTOMY      Immunization History  Administered Date(s) Administered   Influenza Inj Mdck Quad Pf 09/29/2020   PFIZER Comirnaty(Gray Top)Covid-19 Tri-Sucrose Vaccine 03/04/2020, 04/01/2020   PFIZER(Purple Top)SARS-COV-2 Vaccination 03/04/2020,  04/01/2020    MEDICATIONS/ALLERGIES   Current Meds  Medication Sig   atorvastatin (LIPITOR) 40 MG tablet Take 1 tablet (40 mg total) by mouth daily.   clopidogrel (PLAVIX) 75 MG tablet TAKE 1 TABLET(75 MG) BY MOUTH DAILY   fluticasone (FLONASE) 50 MCG/ACT nasal spray    gabapentin (NEURONTIN) 100 MG capsule Take up to 3 capsules qHS as needed   glimepiride (AMARYL) 4 MG tablet Take 4 mg by mouth daily.   glucose blood (FREESTYLE TEST STRIPS) test  strip Check BG 2 times/day.  Dx E11.65   hydrochlorothiazide (HYDRODIURIL) 25 MG tablet Take 25 mg by mouth daily.   JARDIANCE 25 MG TABS tablet Take 1 tablet by mouth daily.   lisinopril (ZESTRIL) 10 MG tablet TAKE 1 TABLET DAILY   metFORMIN (GLUCOPHAGE) 500 MG tablet Take 500 mg 2 (two) times daily with a meal by mouth.    pantoprazole (PROTONIX) 40 MG tablet TAKE 1 TABLET(40 MG) BY MOUTH DAILY   valACYclovir (VALTREX) 1000 MG tablet Take 1,000 mg by mouth 3 (three) times daily.    Allergies  Allergen Reactions   Parafon Forte Dsc [Chlorzoxazone]    Chlorzoxazone Nausea And Vomiting   Septra [Bactrim] Rash and Other (See Comments)    Found out in [employer] Rash itch   Sulfamethoxazole-Trimethoprim Rash    Found out in [employer]    SOCIAL HISTORY/FAMILY HISTORY   Reviewed in Epic:  Pertinent findings:  Social History   Tobacco Use   Smoking status: Never   Smokeless tobacco: Never  Substance Use Topics   Alcohol use: Yes    Comment: SOCIAL   Drug use: No   Social History   Social History Narrative   Married father of one. Exercises routinely without significant symptoms. 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   He does not smoke.   He drinks up to 3 alcohol drinks a week.    OBJCTIVE -PE, EKG, labs   Wt Readings from Last 3 Encounters:  08/24/22 175 lb 6.4 oz (79.6 kg)  05/03/22 190 lb (86.2 kg)  10/11/21 189 lb (85.7 kg)    Physical Exam: BP 99/73   Pulse 90   Ht 5\' 7"  (1.702 m)   Wt 175 lb 6.4 oz (79.6 kg)   SpO2 99%   BMI 27.47 kg/m  Physical Exam   Adult ECG Report  Rate: *** ;  Rhythm: {rhythm:17366};   Narrative Interpretation: ***  Recent Labs:  last checked 09/2021  [place] Related to Lipid Profile Component 09/14/21  12/16/19   LDL Direct 54 65  Total Cholesterol 100 116  Triglycerides 58 86  HDL Cholesterol 34 Low  41  Total Chol / HDL  Cholesterol 2.9 2.8   Lab Results  Component Value Date   CHOL 142 09/07/2017   HDL 41 09/07/2017   LDLCALC 72 09/07/2017   TRIG 147 09/07/2017   CHOLHDL 3.5 09/07/2017   Lab Results  Component Value Date   CREATININE 1.27 09/07/2017   BUN 14 09/07/2017   NA 137 09/07/2017   K 4.7 09/07/2017   CL 97 09/07/2017   CO2 22 09/07/2017      Latest Ref Rng & Units 06/30/2015    8:38 AM 02/10/2012    3:46 AM  CBC  WBC 4.0 - 10.5 K/uL 9.6  11.5   Hemoglobin 13.0 - 17.0 g/dL 13.8  13.7   Hematocrit  39.0 - 52.0 % 40.9  39.7   Platelets 150 - 400 K/uL 194  172     No results found for: "HGBA1C" No results found for: "TSH"  ================================================== I spent a total of ***minutes with the patient spent in direct patient consultation.  Additional time spent with chart review  / charting (studies, outside notes, etc): *** min Total Time: *** min  Current medicines are reviewed at length with the patient today.  (+/- concerns) ***  Notice: This dictation was prepared with Dragon dictation along with smart phrase technology. Any transcriptional errors that result from this process are unintentional and may not be corrected upon review.  Studies Ordered:   No orders of the defined types were placed in this encounter.  No orders of the defined types were placed in this encounter.   Patient Instructions / Medication Changes & Studies & Tests Ordered   There are no Patient Instructions on file for this visit.     Leonie Man, MD, MS Glenetta Hew, M.D., M.S. Interventional Cardiologist  [facility]  Pager # [phone] Phone # [phone] [address] [place], [place] [address]   Thank you for choosing [facility]!!

## 2022-08-24 NOTE — Telephone Encounter (Signed)
Pt c/o of Chest Pain: STAT if CP now or developed within 24 hours  1. Are you having CP right now? no  2. Are you experiencing any other symptoms (ex. SOB, nausea, vomiting, sweating)? SOB, and fatigue, not SOB now  3. How long have you been experiencing CP? A couple weeks  4. Is your CP continuous or coming and going? Comes and goes  5. Have you taken Nitroglycerin? No, has not been that bad  Patient states he has been getting pain in his shoulders and chest. 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 He also states he had a stent put in 10 years ago. ?

## 2022-08-24 NOTE — Assessment & Plan Note (Signed)
Low BP today - not taking HCTZ Cut Lisinopril to 5 mg.

## 2022-08-24 NOTE — Patient Instructions (Addendum)
Medication Instructions:    Decrease  lisinopril to 5 mg from 10 mg daily    *If you need a refill on your cardiac medications before your next appointment, please call your pharmacy*   Lab Work: Not needed   If you have labs (blood work) drawn today and your tests are completely normal, you will receive your results only by: [place] (if you have MyChart) OR A paper copy in the mail If you have any lab test that is abnormal or we need to change your treatment, we will call you to review the results.   Testing/Procedures: Will be schedule at [address] after Sept 27, 2023 Your physician has requested that you have a lexiscan myoview. Please follow instruction below.    Your doctor has scheduled you for a Myocardial Perfusion scan to obtain information about the blood flow to your heart. The test consists of taking pictures of your heart in two phases: while resting and after a stress test.  The stress test --you will be given a drug intended to have a similar effect on the heart to that of exercise.  The test will take approximately 3 to 4  hours to complete.   How to prepare for your test: Do not eat or drink 2 hours prior to your test Do not consume products containing caffeine 12 hours prior to your test (examples: coffee (regular OR decaf), chocolate, sodas, tea) Your doctor may need you to hold certain medications prior to the test.  If so, these are listed below and should not be taken for 24 hours prior to the test.  If not listed below, you may take your medications as normal.  You may resume taking held medications on your normal schedule once the test is complete.   Meds to hold: none Do bring a list of your current medications with you.  If you have held any meds in preparation for the test, please bring them, as you may be required to take them once the test is completed. Do wear comfortable clothes and walking shoes.  Do not wear dresses or  overalls. Do NOT wear cologne, perfume, aftershave, or fragranced lotions the day of your test (deodorants okay). If these instructions are not followed your test will have to be rescheduled.   A nuclear cardiologist will review your test, prepare a report and send it to your physician.   If you have questions or concerns about your appointment, you can call the Nuclear Cardiology department at [phone]. If you cannot keep your appointment, please provide 48 hours notification to avoid a possible $50.00 charge to your account.   Please arrive 15 minutes prior to your appointment time for registration and insurance purposes    Follow-Up: At [facility], you and your health needs are our priority.  As part of our continuing mission to provide you with exceptional heart care, we have created designated Provider Care Teams.  These Care Teams include your primary Cardiologist (physician) and Advanced Practice Providers (APPs -  Physician Assistants and Nurse Practitioners) who all work together to provide you with the care you need, when you need it.  We recommend signing up for the patient portal called "MyChart".  Sign up information is provided on this After Visit Summary.  MyChart is used to connect with patients for Virtual Visits (Telemedicine).  Patients are able to view lab/test results, encounter notes, upcoming appointments, etc.  Non-urgent messages can be sent to  your provider as well.   To learn more about what you can do with MyChart, go to [email or web address].    Your next appointment:   3 week(s)  The format for your next appointment:   In Person  Provider:   Bryan Lemma, MD

## 2022-08-24 NOTE — Telephone Encounter (Signed)
Spoke with patient who reports chest pain/shoulder pain, dyspnea, fatigue for a few weeks. He reports he is very wore out, even when walking up steps at his house. He reports feeling similar to how he did prior to last stent. He did request a heart cath  He reports active shingles over eyebrows BP at PCP was 96/systolic -- this is low for him   Scheduled for visit 08/24/22 with Ellyn Hack MD

## 2022-08-25 ENCOUNTER — Encounter: Payer: Self-pay | Admitting: Cardiology

## 2022-08-25 MED ORDER — HYDROCHLOROTHIAZIDE 25 MG PO TABS: 25.0000 mg | ORAL_TABLET | ORAL | 4 refills | Status: DC | PRN

## 2022-08-25 NOTE — Assessment & Plan Note (Signed)
The present discomfort he is describing sounds more musculoskeletal, however in the setting of fatigue and dyspnea reasonable to exclude ischemia with Myoview.

## 2022-08-25 NOTE — Progress Notes (Incomplete)
Primary Care Provider: Elza Rafter, [place] Cardiologist: Glenetta Hew, MD Electrophysiologist: None  Clinic Note: No chief complaint on file.  ===================================  ASSESSMENT/PLAN   Problem List Items Addressed This Visit       Cardiology Problems   CAD (coronary artery disease), native coronary artery - 80% mid LAD - s/p PCI with DES with distal ~50%, 30-40% RCA & LCx (anomalous LCx takeoff) - Primary (Chronic)   Essential hypertension (Chronic)   Hyperlipidemia associated with type 2 diabetes mellitus (HCC) (Chronic)   Relevant Medications   glimepiride (AMARYL) 4 MG tablet     Other   Presence of stent in LAD coronary artery - Integrity Resolute Drug Eluting Stent, mid LAD, 02/09/2012 (Chronic)   DOE (dyspnea on exertion)   Precordial pain    ===================================  HPI:    Kristopher Jackson is a 66 y.o. male with a PMH notable for CAD-LAD PCI (March 2013), along with DM-2, HTN HLD below who presents today for Urgent Evaluation of Fatigue, Dyspnea and Occasional Chest Pressure.  I last saw [name] on 12/25/2019 for for delayed annual follow-up.  (The preceding year you had Myoview stress test that was nonischemic.  He was doing well with no major complaints.  Maybe a little less active than usual and put on some weight.  Was trying to lose it back.  Staying active working on forearm.  MSK pain and allergies.  Labs look good. No changes made.  Recent Hospitalizations: ***  Reviewed  CV studies:    The following studies were reviewed today: (if available, images/films reviewed: From Epic Chart or Care Everywhere) ***:   Interval History:   Sachin Ferencz   CV Review of Symptoms (Summary) Cardiovascular ROS: {roscv:310661}  REVIEWED OF SYSTEMS   ROS  I have reviewed and (if needed) personally updated the patient's problem list, medications, allergies, past medical and surgical history, social and family  history.   PAST MEDICAL HISTORY   Past Medical History:  Diagnosis Date  . Atypical angina ([place]) 08/30/2017  . Coronary artery disease 02/09/2012   99% early Mid LAD (@Diag ); not on blocker due to bradycardia  . Diabetes mellitus   . Essential hypertension 02/06/2012  . GERD (gastroesophageal reflux disease)   . H/O class III angina pectoris 02/2012   - Referred for cardiac cath  . H/O: pneumonia   . Hyperlipidemia associated with type 2 diabetes mellitus ([place]) 03/01/2014  . Hypertension   . Presence of stent in LAD coronary artery - 02/09/2012    Integrity Resolute Drug Eluting Stent (3.0 mm x 26 mm -> 3.3 mm), mid LAD, 02/09/2012        . Unstable angina pectoris - history of 02/06/2012    PAST SURGICAL HISTORY   Past Surgical History:  Procedure Laterality Date  . CORONARY ANGIOPLASTY WITH STENT PLACEMENT  02/09/2012    80% LAD, (AT branch-point with major diagonal (D2) AND SEPTAL PERFORATOR (SP2) with a IntegrityResolute DES 3.0 mm x 26 mm; final diameter 3.3 mm proximal  & mid ; 3.1 mm distal   . DOPPLER ECHOCARDIOGRAPHY  02/07/2012   EF=>55% BODERLINE LVH,NORMAL DIASTOLIC FUNCTION  . LEFT HEART CATHETERIZATION WITH CORONARY ANGIOGRAM N/A 02/09/2012   Procedure: LEFT HEART CATHETERIZATION WITH CORONARY ANGIOGRAM;  Surgeon: Leonie Man, MD;  Location: [facility] CATH LAB;  Service: Cardiovascular;  Laterality: N/A;  . NM MYOVIEW LTD  09/2017   Exercise 8:35 min, peak HR 141 bpm (88% MPHR).  Hypertensive response with blood  pressure going from 107/78 up to 158/117 mmHg.  10.4 METs.   Stopped due to fatigue and weakness.  Also noted shortness of breath.  No ischemia or infarct noted.   EF ~60%.  . TONSILLECTOMY      Immunization History  Administered Date(s) Administered  . Influenza Inj Mdck Quad Pf 09/29/2020  . PFIZER Comirnaty(Gray Top)Covid-19 Tri-Sucrose Vaccine 03/04/2020, 04/01/2020  . PFIZER(Purple Top)SARS-COV-2 Vaccination 03/04/2020, 04/01/2020    MEDICATIONS/ALLERGIES    Current Meds  Medication Sig  . atorvastatin (LIPITOR) 40 MG tablet Take 1 tablet (40 mg total) by mouth daily.  . clopidogrel (PLAVIX) 75 MG tablet TAKE 1 TABLET(75 MG) BY MOUTH DAILY  . fluticasone (FLONASE) 50 MCG/ACT nasal spray   . gabapentin (NEURONTIN) 100 MG capsule Take up to 3 capsules qHS as needed  . glimepiride (AMARYL) 4 MG tablet Take 4 mg by mouth daily.  Marland Kitchen glucose blood (FREESTYLE TEST STRIPS) test strip Check BG 2 times/day.  Dx E11.65  . hydrochlorothiazide (HYDRODIURIL) 25 MG tablet Take 25 mg by mouth daily.  Marland Kitchen JARDIANCE 25 MG TABS tablet Take 1 tablet by mouth daily.  Marland Kitchen lisinopril (ZESTRIL) 10 MG tablet TAKE 1 TABLET DAILY  . metFORMIN (GLUCOPHAGE) 500 MG tablet Take 500 mg 2 (two) times daily with a meal by mouth.   . pantoprazole (PROTONIX) 40 MG tablet TAKE 1 TABLET(40 MG) BY MOUTH DAILY  . valACYclovir (VALTREX) 1000 MG tablet Take 1,000 mg by mouth 3 (three) times daily.    Allergies  Allergen Reactions  . Parafon Forte Dsc [Chlorzoxazone]   . Chlorzoxazone Nausea And Vomiting  . Septra [Bactrim] Rash and Other (See Comments)    Found out in [employer]. Rash itch  . Sulfamethoxazole-Trimethoprim Rash    Found out in [employer].    SOCIAL HISTORY/FAMILY HISTORY   Reviewed in Epic:  Pertinent findings:  Social History   Tobacco Use  . Smoking status: Never  . Smokeless tobacco: Never  Substance Use Topics  . Alcohol use: Yes    Comment: SOCIAL  . Drug use: No   Social History   Social History Narrative   Married father of one. Exercises routinely without significant symptoms. 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   He does not smoke.   He drinks up to 3 alcohol drinks a week.    OBJCTIVE -PE, EKG, labs   Wt Readings from Last 3 Encounters:  08/24/22 175 lb 6.4 oz (79.6 kg)  05/03/22 190 lb (86.2 kg)  10/11/21 189 lb (85.7 kg)    Physical Exam: BP 99/73    Pulse 90   Ht 5\' 7"  (1.702 m)   Wt 175 lb 6.4 oz (79.6 kg)   SpO2 99%   BMI 27.47 kg/m  Physical Exam   Adult ECG Report  Rate: *** ;  Rhythm: {rhythm:17366};   Narrative Interpretation: ***  Recent Labs:  last checked 09/2021  [facility] Related to Lipid Profile Component 09/14/21  12/16/19   LDL Direct 54 65  Total Cholesterol 100 116  Triglycerides 58 86  HDL Cholesterol 34 Low  41  Total Chol / HDL Cholesterol 2.9 2.8   Lab Results  Component Value Date   CHOL 142 09/07/2017   HDL 41 09/07/2017   LDLCALC 72 09/07/2017   TRIG 147 09/07/2017   CHOLHDL 3.5 09/07/2017   Lab Results  Component Value Date   CREATININE 1.27  09/07/2017   BUN 14 09/07/2017   NA 137 09/07/2017   K 4.7 09/07/2017   CL 97 09/07/2017   CO2 22 09/07/2017      Latest Ref Rng & Units 06/30/2015    8:38 AM 02/10/2012    3:46 AM  CBC  WBC 4.0 - 10.5 K/uL 9.6  11.5   Hemoglobin 13.0 - 17.0 g/dL 52.7  78.2   Hematocrit 39.0 - 52.0 % 40.9  39.7   Platelets 150 - 400 K/uL 194  172     No results found for: "HGBA1C" No results found for: "TSH"  ================================================== I spent a total of ***minutes with the patient spent in direct patient consultation.  Additional time spent with chart review  / charting (studies, outside notes, etc): *** min Total Time: *** min  Current medicines are reviewed at length with the patient today.  (+/- concerns) ***  Notice: This dictation was prepared with Dragon dictation along with smart phrase technology. Any transcriptional errors that result from this process are unintentional and may not be corrected upon review.  Studies Ordered:   No orders of the defined types were placed in this encounter.  No orders of the defined types were placed in this encounter.   Patient Instructions / Medication Changes & Studies & Tests Ordered   There are no Patient Instructions on file for this visit.     Marykay Lex, MD, MS Bryan Lemma, M.D., M.S. Interventional Cardiologist  [facility]  Pager # [phone] Phone # [phone] [address] [place], [place] [address]   Thank you for choosing [facility] [place]!!

## 2022-08-25 NOTE — Assessment & Plan Note (Signed)
His predominant symptom is dyspnea but no other heart failure symptoms.  I would like to get a general cardiac function and also evaluate for ischemia.  If he has stent in place, best option would be Myoview.  Plan: Lexiscan Myoview => See informed shared decision making-informed consent

## 2022-08-25 NOTE — Assessment & Plan Note (Signed)
Patient did recall exactly what symptoms were back in 2013 upon letting his catheterization, but he does recall that a lot of it has to do with profound fatigue, more so than real chest pain or pressure.  He currently is complaining of similar symptoms that are certainly atypical at best, better definitely concerning based on his reduced stamina dyspnea and the complaint of some precordial pain.  Plan:  Evaluate for ischemia => Lexiscan Myoview (but not better walk because of his ankle swelling)   Continue Plavix especially in light of concerning symptoms.  Continue atorvastatin, which he says he is actually taking 20 mg.  (Due for lab recheck in October)  Continue Jardiance (currently on diabetes dose)  On ACE inhibitor-we will reduce dose to 5 mg daily because of hypotension to lab for additional blood pressure.  Perhaps this will help with fatigue.

## 2022-08-25 NOTE — Assessment & Plan Note (Signed)
Remains on Plavix for maintenance therapy.  Appropriately held for previous surgery.  However disease now having symptoms are concerning I would like to continue on Plavix at least to have the results of his Myoview stress test.  He seems to be tolerating with O2 well.  1 Strader through the evaluation of stress test, but then continue with her previous maintenance dose and iindicate that it is okay to hold for procedures 5 to 7 days preop.

## 2022-08-25 NOTE — Assessment & Plan Note (Signed)
Has had notable weight loss.  Has treated diabetes and hyperlipidemia with low HDL.  Not quite meets threshold for metabolic syndrome at this point.

## 2022-08-30 ENCOUNTER — Telehealth (HOSPITAL_COMMUNITY): Payer: Self-pay

## 2022-08-30 NOTE — Telephone Encounter (Signed)
Spoke with the patient, detailed instructions given. He stated that he would be here for his test. Asked to call back with any questions. Kristopher Jackson EMTP 

## 2022-09-01 ENCOUNTER — Ambulatory Visit (HOSPITAL_COMMUNITY): Payer: Medicare Other | Attending: Cardiology

## 2022-09-01 DIAGNOSIS — Z955 Presence of coronary angioplasty implant and graft: Secondary | ICD-10-CM | POA: Insufficient documentation

## 2022-09-01 DIAGNOSIS — I25119 Atherosclerotic heart disease of native coronary artery with unspecified angina pectoris: Secondary | ICD-10-CM | POA: Diagnosis present

## 2022-09-01 DIAGNOSIS — R072 Precordial pain: Secondary | ICD-10-CM | POA: Insufficient documentation

## 2022-09-01 DIAGNOSIS — R0609 Other forms of dyspnea: Secondary | ICD-10-CM | POA: Insufficient documentation

## 2022-09-01 LAB — MYOCARDIAL PERFUSION IMAGING
LV dias vol: 52 mL (ref 62–150)
LV sys vol: 22 mL
Nuc Stress EF: 58 %
Peak HR: 93 {beats}/min
Rest HR: 56 {beats}/min
Rest Nuclear Isotope Dose: 10.5 mCi
SDS: 3
SRS: 1
SSS: 4
ST Depression (mm): 0 mm
Stress Nuclear Isotope Dose: 30.1 mCi
TID: 0.96

## 2022-09-01 MED ORDER — REGADENOSON 0.4 MG/5ML IV SOLN
0.4000 mg | Freq: Once | INTRAVENOUS | Status: AC
  Administered 2022-09-01: 0.4 mg via INTRAVENOUS

## 2022-09-01 MED ORDER — TECHNETIUM TC 99M TETROFOSMIN IV KIT
30.1000 | PACK | Freq: Once | INTRAVENOUS | Status: AC | PRN
  Administered 2022-09-01: 30.1 via INTRAVENOUS

## 2022-09-01 MED ORDER — TECHNETIUM TC 99M TETROFOSMIN IV KIT
10.5000 | PACK | Freq: Once | INTRAVENOUS | Status: AC | PRN
  Administered 2022-09-01: 10.5 via INTRAVENOUS

## 2022-09-13 ENCOUNTER — Encounter: Payer: Self-pay | Admitting: Cardiology

## 2022-09-13 ENCOUNTER — Ambulatory Visit: Payer: Medicare Other | Attending: Cardiology | Admitting: Cardiology

## 2022-09-13 VITALS — BP 110/62 | HR 72 | Ht 67.8 in | Wt 183.8 lb

## 2022-09-13 DIAGNOSIS — Z955 Presence of coronary angioplasty implant and graft: Secondary | ICD-10-CM | POA: Insufficient documentation

## 2022-09-13 DIAGNOSIS — E669 Obesity, unspecified: Secondary | ICD-10-CM | POA: Insufficient documentation

## 2022-09-13 DIAGNOSIS — I10 Essential (primary) hypertension: Secondary | ICD-10-CM | POA: Insufficient documentation

## 2022-09-13 DIAGNOSIS — E785 Hyperlipidemia, unspecified: Secondary | ICD-10-CM | POA: Diagnosis present

## 2022-09-13 DIAGNOSIS — E1169 Type 2 diabetes mellitus with other specified complication: Secondary | ICD-10-CM | POA: Insufficient documentation

## 2022-09-13 DIAGNOSIS — I25119 Atherosclerotic heart disease of native coronary artery with unspecified angina pectoris: Secondary | ICD-10-CM | POA: Insufficient documentation

## 2022-09-13 NOTE — Progress Notes (Signed)
Primary Care Provider: Elza Rafter, [place] Cardiologist: Glenetta Hew, MD Electrophysiologist: None  Clinic Note: No chief complaint on file.  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  His Myoview stress test was nonischemic but no change compared to 2018.  This is very reassuring.  Goes along with the fact that he is not feeling poorly anymore.  Plan: Continue current dose of atorvastatin although he will probably need to get labs checked since his A1c went crazy. Work closely with PCP to treat diabetes.  On combination of metformin Jardiance and glimepiride. Only on low-dose lisinopril.  Not on beta-blocker because of bradycardia and fatigue. On Plavix monotherapy, tolerating well without any significant bleeding or bruising. Okay to hold Plavix 5 to 7 days preop for surgeries or procedures.      Essential hypertension (Chronic)    BP is well controlled is only on very low-dose of lisinopril that was recently cut down to 5 mg.  No longer taking standing HCT C, using as needed edema.  He is had some low blood pressures therefore we are putting hold parameters for SBP less than 115 mmHg.        Hyperlipidemia associated with type 2 diabetes mellitus (HCC) (Chronic)    As of last year his lipids are well controlled, but now his A1c is gone from 7.1 to over 12.  He should be having labs checked by PCP soon I suspect we may need to do more aggressive with treatment treating his lipids.  We will contact him to ensure that he is getting his lipids checked by PCP this year.  If not we can get that checked here.        Other   Presence of stent in  LAD coronary artery - Integrity Resolute Drug Eluting Stent, mid LAD, 02/09/2012 (Chronic)    On maintenance SAPT with clopidogrel/Plavix 75 mg daily.  Less GI upset than with aspirin.  Okay to hold Plavix 5 to 7 days preop for surgical procedure.      Obesity (BMI 30.0-34.9) (Chronic)    His weight is reduced now with a BMI of 28.  However he was probably a little too weak and fatigued with weight down 10 pounds more.  He put some back on is feeling much better.  We will probably need to stay the current weight for a little while.      ===================================  HPI:    Kristopher Jackson is a 66 y.o. male with a PMH notable for CAD-LAD PCI (March 2013), along with DM-2, HTN HLD below who presents today for Urgent Evaluation of Fatigue, Dyspnea and Occasional Chest Pressure.  I last saw Arvin Collard on with complaints of extreme fatigue and dyspnea.  This is worse with exertion.  He also has a potential chest tightness or pressure.  He indicated that his symptoms were back to the time of his ankle surgery.  Shortly thereafter he did wean off the boot but over the next month or so he had overdone it and he had some swelling and inflammation.  He was put back into the boot, treated with prednisone.  This  led to his A1c going up.  It also led to him being less active.  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  Noted exertional dyspnea walking into his driveway or lifting anything more than a few pounds.  Even noted some chest discomfort bending over and using his arms.  No CHF symptoms of PND, orthopnea distal trivial swelling at the affected ankle.  He was upset about his blood sugars get out of control.  He indicated that he actually was not using his HCTZ very frequently despite having swelling.  He also cut his atorvastatin down to 20 mg daily because of muscle aches. ROS + for fatigue/malaise, weight loss, dyspnea, chest pain and leg swelling as  well as joint pain in the left ankle. =>  Lexiscan Myoview Ordered Lisinopril dose decreased to 5 mg  Recent Hospitalizations: None   Reviewed  CV studies:     The following studies were reviewed today: (if available, images/films reviewed: From Epic Chart or Care Everywhere) [place] 08/24/2022: LOW RISK.  EF estimated 55 to 60% (50%).  Normal LV size and function.  No ischemia or infarction.  No change from 2018  Interval History:   Dornell Jacque presents here today actually stated he is feeling really well.  He said that he change his diet around and has been really working on his hydration trying to lose weight.  He started doing a rehydration probably where he takes a gallon of water and puts teaspoon of baking soda a teaspoon of no salt and a teaspoon of [place] salt.  He will drink the whole gallon throughout the course of the day.  He has noted dramatic change in his overall demeanor.  He is no longer having dizziness no longer having muscle aches or cramping.  Overall feeling much better.  He is back to his daily routine with no issues.  It is if he never really had any issues with him.  He really has not had any edema for which she needs to take his HCTZ.  He does think that reducing the lisinopril dose helped as well.  Not having nearly the dizzy spells that he was having.  What has been doing as he has actually been holding it completely if his blood pressures been less than about 115 to 123456 mmHg systolic.  When he does that he has been feeling a whole lot better.  Cardiovascular ROS: no chest pain or dyspnea on exertion positive for - edema and this is usually very minimal.  Very infrequently uses HCTZ.  Occasionally has dizziness and usually that is associated with whether not his blood pressures are low.  In these situations he would hold his lisinopril. negative for - irregular heartbeat, orthopnea, palpitations, paroxysmal nocturnal dyspnea, rapid heart rate, shortness of  breath, or any further episodes of lightheadedness dizziness or wooziness.  No syncope/near syncope or TIA/amaurosis fugax.  No claudication.    REVIEWED OF SYSTEMS   Review of Systems  Constitutional:  Positive for weight loss (He has put back on some of his weight and feels better.  Now just try to maintain his weight at current level.). Negative for chills, fever and malaise/fatigue (He feels a whole lot better now.  Energy level notably improved.).  HENT:  Negative for nosebleeds.   Respiratory:  Negative for cough, shortness of breath and wheezing.   Cardiovascular:  Positive for leg swelling (Trivial has used HCTZ maybe twice in the last month.).  Negative for chest pain.  Gastrointestinal:  Negative for abdominal pain, blood in stool, heartburn and melena.  Genitourinary:  Negative for dysuria and hematuria.  Musculoskeletal:  Positive for joint pain (Left ankle). Negative for falls and myalgias.  Neurological:  Negative for dizziness.  Psychiatric/Behavioral:  Negative for depression and memory loss. The patient is not nervous/anxious and does not have insomnia.        Overall much better outlook since I last saw him.  He thinks a lot of the reason why he is feeling poorly.  Due to psychosocial issues.  Things are stabilized out now.   I have reviewed and (if needed) personally updated the patient's problem list, medications, allergies, past medical and surgical history, social and family history.   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   Coronary artery disease 02/09/2012   99% early Mid LAD (@Diag ); not on blocker due to bradycardia   Diabetes mellitus    Essential hypertension 02/06/2012   GERD (gastroesophageal reflux disease)    H/O class III angina pectoris 02/2012   - Referred for cardiac cath   H/O: pneumonia    Hyperlipidemia associated with type 2 diabetes mellitus ([place]) 03/01/2014   Hypertension    Presence of stent  in LAD coronary artery - 02/09/2012    Integrity Resolute Drug Eluting Stent (3.0 mm x 26 mm -> 3.3 mm), mid LAD, 02/09/2012         Unstable angina pectoris - history of 02/06/2012    PAST SURGICAL HISTORY   Past Surgical History:  Procedure Laterality Date   CORONARY ANGIOPLASTY WITH STENT PLACEMENT  02/09/2012   80% LAD, (AT branch-point with major diagonal (D2) AND SEPTAL PERFORATOR (SP2) with a IntegrityResolute DES 3.0 mm x 26 mm; final diameter 3.3 mm proximal  & mid ; 3.1 mm distal    DOPPLER ECHOCARDIOGRAPHY  02/07/2012   EF=>55% BODERLINE LVH,NORMAL DIASTOLIC FUNCTION   LEFT HEART CATHETERIZATION WITH CORONARY ANGIOGRAM N/A 02/09/2012   Procedure: LEFT HEART CATHETERIZATION WITH CORONARY ANGIOGRAM;  Surgeon: Leonie Man, MD;  Location: [facility] CATH LAB;  Service: Cardiovascular;  Laterality: N/A;   NM MYOVIEW LTD  08/24/2022   LOW RISK.  EF estimated 55 to 60% (50%).  Normal LV size and function.  No ischemia or infarction.  No change from 2018   TONSILLECTOMY      Immunization History  Administered Date(s) Administered   Influenza Inj Mdck Quad Pf 09/29/2020   PFIZER Comirnaty(Gray Top)Covid-19 Tri-Sucrose Vaccine 03/04/2020, 04/01/2020   PFIZER(Purple Top)SARS-COV-2 Vaccination 03/04/2020, 04/01/2020    MEDICATIONS/ALLERGIES   Current Meds  Medication Sig   atorvastatin (LIPITOR) 20 MG tablet Take 20 mg by mouth daily.   clopidogrel (PLAVIX) 75 MG tablet TAKE 1 TABLET(75 MG) BY MOUTH DAILY   gabapentin (NEURONTIN) 100 MG capsule Take up to 3 capsules qHS as needed   glimepiride (AMARYL) 4 MG tablet Take 4 mg by mouth daily.   glucose blood (FREESTYLE TEST STRIPS) test strip Check BG 2 times/day.  Dx E11.65   hydrochlorothiazide (HYDRODIURIL) 25 MG tablet Take 1 tablet (25 mg total) by mouth as needed.   JARDIANCE 25 MG TABS tablet Take 1 tablet by mouth daily.   lisinopril (ZESTRIL) 5 MG tablet Take 1 tablet (5 mg total) by mouth daily.   metFORMIN (GLUCOPHAGE) 500 MG  tablet Take 500 mg 2 (two) times daily with a meal by mouth.    pantoprazole (PROTONIX) 40 MG  tablet TAKE 1 TABLET(40 MG) BY MOUTH DAILY   [DISCONTINUED] fluticasone (FLONASE) 50 MCG/ACT nasal spray    [DISCONTINUED] valACYclovir (VALTREX) 1000 MG tablet Take 1,000 mg by mouth 3 (three) times daily.    Allergies  Allergen Reactions   Parafon Forte Dsc [Chlorzoxazone]    Chlorzoxazone Nausea And Vomiting   Septra [Bactrim] Rash and Other (See Comments)    Found out in [employer] Rash itch   Sulfamethoxazole-Trimethoprim Rash    Found out in [employer]    SOCIAL HISTORY/FAMILY HISTORY   Reviewed in Epic:  Pertinent findings:  Social History   Tobacco Use   Smoking status: Never   Smokeless tobacco: Never  Substance Use Topics   Alcohol use: Yes    Comment: SOCIAL   Drug use: No   Social History   Social History Narrative   Married father of one. Exercises routinely without significant symptoms. 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   He does not smoke.   He drinks up to 3 alcohol drinks a week.    OBJCTIVE -PE, EKG, labs   Wt Readings from Last 3 Encounters:  09/13/22 183 lb 12.8 oz (83.4 kg)  09/01/22 173 lb (78.5 kg)  08/24/22 175 lb 6.4 oz (79.6 kg)    Physical Exam: BP 110/62   Pulse 72   Ht 5' 7.8" (1.722 m)   Wt 183 lb 12.8 oz (83.4 kg)   SpO2 100%   BMI 28.11 kg/m  Physical Exam Vitals reviewed.  Constitutional:      General: He is not in acute distress (Just seems upset, fatigued and worn out.).    Appearance: He is normal weight. He is not ill-appearing or toxic-appearing.     Comments: Notably lighter than my last saw him  HENT:     Head: Normocephalic and atraumatic.  Cardiovascular:     Rate and Rhythm: Normal rate and regular rhythm.     Pulses: Normal pulses.     Heart sounds: Normal heart sounds.  Pulmonary:     Effort: Pulmonary effort is normal. No respiratory  distress.     Breath sounds: Normal breath sounds. No wheezing, rhonchi or rales.  Musculoskeletal:        General: No swelling (Left foot somewhat swollen-edema). Normal range of motion.     Cervical back: Normal range of motion and neck supple.  Skin:    General: Skin is warm and dry.  Neurological:     General: No focal deficit present.     Mental Status: He is alert and oriented to person, place, and time.     Gait: Gait normal.  Psychiatric:        Mood and Affect: Mood normal.        Behavior: Behavior normal.        Thought Content: Thought content normal.        Judgment: Judgment normal.     Comments: Much better mood.     Adult ECG Report Not checked  Recent Labs: He is due for labs.  Just had A1c checked by his PCP but nothing else.  A1c was well out of control. 08/22/2022: A1c 12.5!!!  last checked 09/2021  [facility] Related to Lipid Profile Component 09/14/21  12/16/19   LDL Direct 54 65  Total Cholesterol 100 116  Triglycerides 58 86  HDL Cholesterol 34 Low  41  Total Chol /  HDL Cholesterol 2.9 2.8   09/14/2021 09/14/21   Sodium 142   Potassium 4.5   Chloride 106   CO2 27   BUN 17   Glucose 110 High    Creatinine 1.07   Calcium 9.4   Total Protein 6.2   Albumin  4.5   Total Bilirubin 0.8   Alkaline Phosphatase 91   AST (SGOT) 33   ALT (SGPT) 28   Anion Gap 9   Est. GFR 77    ================================================== I spent a total of 16 min with the patient spent in direct patient consultation.  Additional time spent with chart review  / charting (studies, outside notes, etc): 17 min Total Time: 33 min  Current medicines are reviewed at length with the patient today.  (+/- concerns) dizziness - => for SBP < 115, hold lisinopril  Notice: This dictation was prepared with Dragon dictation along with smart phrase technology. Any transcriptional errors that result from this process are unintentional and may not be  corrected upon review.  Studies Ordered:   No orders of the defined types were placed in this encounter.  No orders of the defined types were placed in this encounter.   Patient Instructions / Medication Changes & Studies & Tests Ordered   Patient Instructions  Medication Instructions:   No changees *If you need a refill on your cardiac medications before your next appointment, please call your pharmacy*   Lab Work: Not needed    Testing/Procedures: Not needed   Follow-Up: At [facility], you and your health needs are our priority.  As part of our continuing mission to provide you with exceptional heart care, we have created designated Provider Care Teams.  These Care Teams include your primary Cardiologist (physician) and Advanced Practice Providers (APPs -  Physician Assistants and Nurse Practitioners) who all work together to provide you with the care you need, when you need it.     Your next appointment:   12 month(s)  The format for your next appointment:   In Person  Provider:   Glenetta Hew, MD       Leonie Man, MD, MS Glenetta Hew, M.D., M.S. Interventional Cardiologist  [place]  Pager # [phone] Phone # [phone] [address] [place], [place] [address]   Thank you for choosing [facility]!!

## 2022-09-13 NOTE — Patient Instructions (Signed)
Medication Instructions:   No changees *If you need a refill on your cardiac medications before your next appointment, please call your pharmacy*   Lab Work: Not needed    Testing/Procedures: Not needed   Follow-Up: At [facility], you and your health needs are our priority.  As part of our continuing mission to provide you with exceptional heart care, we have created designated Provider Care Teams.  These Care Teams include your primary Cardiologist (physician) and Advanced Practice Providers (APPs -  Physician Assistants and Nurse Practitioners) who all work together to provide you with the care you need, when you need it.     Your next appointment:   12 month(s)  The format for your next appointment:   In Person  Provider:   Glenetta Hew, MD

## 2022-09-24 ENCOUNTER — Encounter: Payer: Self-pay | Admitting: Cardiology

## 2022-09-24 NOTE — Assessment & Plan Note (Signed)
His weight is reduced now with a BMI of 28.  However he was probably a little too weak and fatigued with weight down 10 pounds more.  He put some back on is feeling much better.  We will probably need to stay the current weight for a little while.

## 2022-09-24 NOTE — Assessment & Plan Note (Signed)
Thankfully, he is not having any more chest pain or pressure.  His Myoview stress test was nonischemic but no change compared to 2018.  This is very reassuring.  Goes along with the fact that he is not feeling poorly anymore.  Plan: Continue current dose of atorvastatin although he will probably need to get labs checked since his A1c went crazy.  Work closely with PCP to treat diabetes.  On combination of metformin Jardiance and glimepiride.  Only on low-dose lisinopril.  Not on beta-blocker because of bradycardia and fatigue.  On Plavix monotherapy, tolerating well without any significant bleeding or bruising.  Okay to hold Plavix 5 to 7 days preop for surgeries or procedures.

## 2022-09-24 NOTE — Assessment & Plan Note (Signed)
BP is well controlled is only on very low-dose of lisinopril that was recently cut down to 5 mg.  No longer taking standing HCT C, using as needed edema.  He is had some low blood pressures therefore we are putting hold parameters for SBP less than 115 mmHg.

## 2022-09-24 NOTE — Assessment & Plan Note (Signed)
As of last year his lipids are well controlled, but now his A1c is gone from 7.1 to over 12.  He should be having labs checked by PCP soon I suspect we may need to do more aggressive with treatment treating his lipids.  We will contact him to ensure that he is getting his lipids checked by PCP this year.  If not we can get that checked here.

## 2022-09-24 NOTE — Assessment & Plan Note (Signed)
On maintenance SAPT with clopidogrel/Plavix 75 mg daily.  Less GI upset than with aspirin.  Okay to hold Plavix 5 to 7 days preop for surgical procedure.

## 2023-01-27 ENCOUNTER — Other Ambulatory Visit: Payer: Self-pay | Admitting: Cardiology

## 2023-03-06 DEATH — deceased
